# Patient Record
Sex: Male | Born: 1945 | Race: Black or African American | Hispanic: No | Marital: Single | Smoking: Former smoker
Health system: Southern US, Community
[De-identification: ages and names within clinical notes are randomized; demographics above are authoritative.]

## PROBLEM LIST (undated history)

## (undated) DIAGNOSIS — C7931 Secondary malignant neoplasm of brain: Secondary | ICD-10-CM

## (undated) DIAGNOSIS — C3490 Malignant neoplasm of unspecified part of unspecified bronchus or lung: Secondary | ICD-10-CM

---

## 2017-02-03 ENCOUNTER — Encounter (HOSPITAL_COMMUNITY): Payer: Self-pay

## 2017-02-03 ENCOUNTER — Emergency Department (HOSPITAL_COMMUNITY): Payer: Medicare Other

## 2017-02-03 ENCOUNTER — Inpatient Hospital Stay (HOSPITAL_COMMUNITY)
Admission: EM | Admit: 2017-02-03 | Discharge: 2017-03-08 | DRG: 175 | Disposition: E | Payer: Medicare Other | Attending: Internal Medicine | Admitting: Internal Medicine

## 2017-02-03 DIAGNOSIS — Z85118 Personal history of other malignant neoplasm of bronchus and lung: Secondary | ICD-10-CM

## 2017-02-03 DIAGNOSIS — R000 Tachycardia, unspecified: Secondary | ICD-10-CM | POA: Diagnosis present

## 2017-02-03 DIAGNOSIS — R001 Bradycardia, unspecified: Secondary | ICD-10-CM | POA: Diagnosis present

## 2017-02-03 DIAGNOSIS — I452 Bifascicular block: Secondary | ICD-10-CM | POA: Diagnosis present

## 2017-02-03 DIAGNOSIS — D696 Thrombocytopenia, unspecified: Secondary | ICD-10-CM | POA: Diagnosis present

## 2017-02-03 DIAGNOSIS — R590 Localized enlarged lymph nodes: Secondary | ICD-10-CM | POA: Diagnosis present

## 2017-02-03 DIAGNOSIS — C7931 Secondary malignant neoplasm of brain: Secondary | ICD-10-CM | POA: Diagnosis present

## 2017-02-03 DIAGNOSIS — I248 Other forms of acute ischemic heart disease: Secondary | ICD-10-CM | POA: Diagnosis present

## 2017-02-03 DIAGNOSIS — I959 Hypotension, unspecified: Secondary | ICD-10-CM | POA: Diagnosis present

## 2017-02-03 DIAGNOSIS — I2699 Other pulmonary embolism without acute cor pulmonale: Principal | ICD-10-CM | POA: Diagnosis present

## 2017-02-03 DIAGNOSIS — J189 Pneumonia, unspecified organism: Secondary | ICD-10-CM | POA: Diagnosis present

## 2017-02-03 DIAGNOSIS — R0602 Shortness of breath: Secondary | ICD-10-CM

## 2017-02-03 DIAGNOSIS — D649 Anemia, unspecified: Secondary | ICD-10-CM | POA: Diagnosis present

## 2017-02-03 DIAGNOSIS — R570 Cardiogenic shock: Secondary | ICD-10-CM | POA: Diagnosis present

## 2017-02-03 DIAGNOSIS — I10 Essential (primary) hypertension: Secondary | ICD-10-CM | POA: Diagnosis present

## 2017-02-03 DIAGNOSIS — I2609 Other pulmonary embolism with acute cor pulmonale: Secondary | ICD-10-CM

## 2017-02-03 DIAGNOSIS — E860 Dehydration: Secondary | ICD-10-CM | POA: Diagnosis present

## 2017-02-03 DIAGNOSIS — R6510 Systemic inflammatory response syndrome (SIRS) of non-infectious origin without acute organ dysfunction: Secondary | ICD-10-CM | POA: Diagnosis not present

## 2017-02-03 DIAGNOSIS — R071 Chest pain on breathing: Secondary | ICD-10-CM | POA: Diagnosis not present

## 2017-02-03 DIAGNOSIS — Z66 Do not resuscitate: Secondary | ICD-10-CM | POA: Diagnosis present

## 2017-02-03 DIAGNOSIS — J984 Other disorders of lung: Secondary | ICD-10-CM | POA: Diagnosis not present

## 2017-02-03 DIAGNOSIS — R079 Chest pain, unspecified: Secondary | ICD-10-CM | POA: Diagnosis present

## 2017-02-03 DIAGNOSIS — C3490 Malignant neoplasm of unspecified part of unspecified bronchus or lung: Secondary | ICD-10-CM | POA: Diagnosis present

## 2017-02-03 DIAGNOSIS — I2602 Saddle embolus of pulmonary artery with acute cor pulmonale: Secondary | ICD-10-CM | POA: Diagnosis not present

## 2017-02-03 DIAGNOSIS — Z9221 Personal history of antineoplastic chemotherapy: Secondary | ICD-10-CM | POA: Diagnosis not present

## 2017-02-03 DIAGNOSIS — J181 Lobar pneumonia, unspecified organism: Secondary | ICD-10-CM | POA: Diagnosis not present

## 2017-02-03 HISTORY — DX: Secondary malignant neoplasm of brain: C79.31

## 2017-02-03 HISTORY — DX: Malignant neoplasm of unspecified part of unspecified bronchus or lung: C34.90

## 2017-02-03 LAB — I-STAT CHEM 8, ED
BUN: 31 mg/dL — ABNORMAL HIGH (ref 6–20)
CREATININE: 0.7 mg/dL (ref 0.61–1.24)
Calcium, Ion: 1.05 mmol/L — ABNORMAL LOW (ref 1.15–1.40)
Chloride: 102 mmol/L (ref 101–111)
Glucose, Bld: 106 mg/dL — ABNORMAL HIGH (ref 65–99)
HEMATOCRIT: 37 % — AB (ref 39.0–52.0)
HEMOGLOBIN: 12.6 g/dL — AB (ref 13.0–17.0)
POTASSIUM: 4.1 mmol/L (ref 3.5–5.1)
Sodium: 138 mmol/L (ref 135–145)
TCO2: 32 mmol/L (ref 0–100)

## 2017-02-03 LAB — CBC
HEMATOCRIT: 38 % — AB (ref 39.0–52.0)
Hemoglobin: 12.5 g/dL — ABNORMAL LOW (ref 13.0–17.0)
MCH: 29.6 pg (ref 26.0–34.0)
MCHC: 32.9 g/dL (ref 30.0–36.0)
MCV: 90 fL (ref 78.0–100.0)
Platelets: 70 10*3/uL — ABNORMAL LOW (ref 150–400)
RBC: 4.22 MIL/uL (ref 4.22–5.81)
RDW: 15.9 % — ABNORMAL HIGH (ref 11.5–15.5)
WBC: 11 10*3/uL — AB (ref 4.0–10.5)

## 2017-02-03 LAB — I-STAT TROPONIN, ED: Troponin i, poc: 0.14 ng/mL (ref 0.00–0.08)

## 2017-02-03 LAB — D-DIMER, QUANTITATIVE (NOT AT ARMC): D DIMER QUANT: 17.08 ug{FEU}/mL — AB (ref 0.00–0.50)

## 2017-02-03 MED ORDER — ACETAMINOPHEN 650 MG RE SUPP: 650.0000 mg | Freq: Four times a day (QID) | RECTAL | Status: DC | PRN

## 2017-02-03 MED ORDER — ONDANSETRON HCL 4 MG/2ML IJ SOLN: 4.0000 mg | Freq: Four times a day (QID) | INTRAMUSCULAR | Status: DC | PRN

## 2017-02-03 MED ORDER — HEPARIN (PORCINE) IN NACL 100-0.45 UNIT/ML-% IJ SOLN: 1000.0000 [IU]/h | INTRAMUSCULAR | Status: DC

## 2017-02-03 MED ORDER — HEPARIN BOLUS VIA INFUSION
4000.0000 [IU] | Freq: Once | INTRAVENOUS | Status: AC
  Administered 2017-02-03: 4000 [IU] via INTRAVENOUS
  Filled 2017-02-03: qty 4000

## 2017-02-03 MED ORDER — IPRATROPIUM-ALBUTEROL 0.5-2.5 (3) MG/3ML IN SOLN: 3.0000 mL | RESPIRATORY_TRACT | Status: DC | PRN

## 2017-02-03 MED ORDER — SODIUM CHLORIDE 0.9 % IV SOLN
INTRAVENOUS | Status: DC
  Administered 2017-02-04: via INTRAVENOUS

## 2017-02-03 MED ORDER — VANCOMYCIN HCL IN DEXTROSE 1-5 GM/200ML-% IV SOLN
1000.0000 mg | Freq: Two times a day (BID) | INTRAVENOUS | Status: DC
  Administered 2017-02-04: 1000 mg via INTRAVENOUS
  Filled 2017-02-03 (×2): qty 200

## 2017-02-03 MED ORDER — IOPAMIDOL (ISOVUE-370) INJECTION 76%
INTRAVENOUS | Status: AC
  Administered 2017-02-03: 100 mL
  Filled 2017-02-03: qty 100

## 2017-02-03 MED ORDER — BISACODYL 5 MG PO TBEC
5.0000 mg | DELAYED_RELEASE_TABLET | Freq: Every day | ORAL | Status: DC | PRN
  Filled 2017-02-03: qty 1

## 2017-02-03 MED ORDER — MORPHINE SULFATE (PF) 4 MG/ML IV SOLN: 1.0000 mg | INTRAVENOUS | Status: DC | PRN

## 2017-02-03 MED ORDER — POLYETHYLENE GLYCOL 3350 17 G PO PACK: 17.0000 g | PACK | Freq: Every day | ORAL | Status: DC | PRN

## 2017-02-03 MED ORDER — ONDANSETRON HCL 4 MG PO TABS: 4.0000 mg | ORAL_TABLET | Freq: Four times a day (QID) | ORAL | Status: DC | PRN

## 2017-02-03 MED ORDER — OXYCODONE HCL 5 MG PO TABS: 5.0000 mg | ORAL_TABLET | ORAL | Status: DC | PRN

## 2017-02-03 MED ORDER — HEPARIN (PORCINE) IN NACL 100-0.45 UNIT/ML-% IJ SOLN
1200.0000 [IU]/h | INTRAMUSCULAR | Status: DC
  Administered 2017-02-03: 1200 [IU]/h via INTRAVENOUS
  Filled 2017-02-03: qty 250

## 2017-02-03 MED ORDER — SODIUM CHLORIDE 0.9% FLUSH
3.0000 mL | Freq: Two times a day (BID) | INTRAVENOUS | Status: DC
  Administered 2017-02-04 – 2017-02-05 (×5): 3 mL via INTRAVENOUS

## 2017-02-03 MED ORDER — ASPIRIN 81 MG PO CHEW: 324.0000 mg | CHEWABLE_TABLET | Freq: Once | ORAL | Status: DC

## 2017-02-03 MED ORDER — ACETAMINOPHEN 325 MG PO TABS
650.0000 mg | ORAL_TABLET | Freq: Four times a day (QID) | ORAL | Status: DC | PRN
  Administered 2017-02-05: 650 mg via ORAL
  Filled 2017-02-03: qty 2

## 2017-02-03 MED ORDER — PANTOPRAZOLE SODIUM 40 MG IV SOLR
40.0000 mg | INTRAVENOUS | Status: DC
  Administered 2017-02-04: 40 mg via INTRAVENOUS
  Filled 2017-02-03: qty 40

## 2017-02-03 MED ORDER — DEXTROSE 5 % IV SOLN
1.0000 g | Freq: Three times a day (TID) | INTRAVENOUS | Status: DC
  Administered 2017-02-04 – 2017-02-05 (×7): 1 g via INTRAVENOUS
  Filled 2017-02-03 (×9): qty 1

## 2017-02-03 NOTE — ED Notes (Signed)
Patient transported to CT 

## 2017-02-03 NOTE — Progress Notes (Signed)
eLink Physician-Brief Progress Note Patient Name: Ido Wollman DOB: 05/22/1946 MRN: [id number]   Date of Service  02/02/2017  HPI/Events of Note  Called for code PE H/O Lung cancer with mets to brain. B/L PE. RV/LV ratio 1.1 Hemodynamically stable, on 2 lt O2  eICU Interventions  Start heparin anticoagulation. No role for lytics due to brain mets        Gwenn Teodoro 01/09/2017, 10:56 PM

## 2017-02-03 NOTE — ED Notes (Signed)
Patient transported to X-ray 

## 2017-02-03 NOTE — H&P (Signed)
History and Physical    Julian Bryant GEX:[id number] DOB: [birth year] DOA: 01/26/2017  PCP: No PCP Per Patient   Patient coming from: Home  Chief Complaint: Chest pain, productive cough   HPI: Julian Bryant is a 71 y.o. male with medical history significant for lung cancer with brain metastasis receiving treatment in [place], [place] and now presenting to the emergency department with several days of cough productive of thick brown sputum, and acute onset of chest pain radiating through to the back. Patient is unable to contribute very much to the history due to confusion which has reportedly been stable since December and attributed to a brain metastasis. History is therefore obtained from the patient's daughter with whom he has been living. Patient had reportedly undergone his second gamma knife treatment on 01/20/2017 and had been complaining of headaches, improved with steroids, but otherwise seemed to be in his usual state until the development of a productive cough several days ago. Patient has been coughing with thick brown sputum and had an antibiotic called in by his oncologist today but had not yet started it. This afternoon, his daughter reports that he began to complain of chest pain radiating through to his back. This was a new complaint for the patient and prompted his daughter to bring him in to the ED for evaluation. His daughter also notes that he has been constipated, straining to move his bowels, and she has noted scant amounts of blood mixed in with his stool. This was discussed with and oncology navigator who advised a stool softener that he has been using. Daughter does not know of any other bleeding events. She reports that the patient had told her and the other family members that he would not want to be kept alive on a machine or be recess attended in the case of cardiac arrest, and his wife has reportedly been very supportive of this decision.   ED Course: Upon arrival to the ED,  patient is found to be afebrile, saturating adequately on room air, and with vital signs stable. EKG featured a sinus rhythm with right bundle-branch block, left anterior fascicular block, LVH, and anterior lateral T-wave abnormalities with no prior available. She chemistry panel is notable for a market elevation and BUN to creatinine ratio and CBC is notable for leukocytosis to 11,000, normocytic anemia with hemoglobin 12.5, and thrombocytopenia with platelets 70,000. Troponin is elevated to 0.14. D-dimer was elevated to 17.08. CTAP study reveals PE within the pulmonary arteries to right upper lobe, right lower lobe, and left lower lobe with CT evidence for right heart strain. Also noted on CTA has a 2.6 cm cavitary lesion with air-fluid level in the left base, likely an atypical infection, but given the history of malignancy is not excluded. Enlarged mediastinal nodes are noted on the CTA. Code PE was activated by the ED provider and the patient has been started on heparin infusion. PCCM evaluated the case and given his brain metastases, there is no role for lytics. Patient remained hemodynamically stable in the ED, he is saturating well on 2 L/m supplemental oxygen, and with mild increased work of breathing. He will be admitted to the stepdown unit for ongoing evaluation and management of chest pain with mild respiratory distress secondary to acute PE with heart strain.  Review of Systems:  Unable to obtain complete ROS secondary to patient's clinical condition with significant cognitive impairment.  Past Medical History:  Diagnosis Date  . Lung cancer ([place])   . Metastatic cancer to  brain [facility])     History reviewed. No pertinent surgical history.   reports that he has quit smoking. He has never used smokeless tobacco. He reports that he does not drink alcohol or use drugs.  No Known Allergies  History reviewed. No pertinent family history.   Prior to Admission medications   Not on File     Physical Exam: Vitals:   02/04/2017 2145 01/25/2017 2200 01/27/2017 2215 01/11/2017 2245  BP: 123/71 109/66 108/66 135/73  Pulse: 66 69 71 76  Resp:  (!) 9 (!) 8 24  Temp:      TempSrc:      SpO2: 99% 99% 98% 99%  Weight:   77.1 kg (170 lb)       Constitutional: NAD, calm, comfortable. Increased WOB.  Eyes: PERTLA, lids and conjunctivae normal ENMT: Mucous membranes are moist. Posterior pharynx clear of any exudate or lesions.   Neck: normal, supple, no masses, no thyromegaly Respiratory: Coarse rhonchi at left mid-lung. Accessory muscle recruitment. No pallor or cyanosis.  Cardiovascular: S1 & S2 heard, regular rate and rhythm. No extremity edema. No significant JVD. Abdomen: No distension, no tenderness, no masses palpated. Bowel sounds normal.  Musculoskeletal: no clubbing / cyanosis. No joint deformity upper and lower extremities. Normal muscle tone.  Skin: no significant rashes, lesions, ulcers. Warm, dry, well-perfused. Poor turgor.  Neurologic: No gross facial asymmetry. Moving all extremities spontaneously. Grip strength 4-5/5 b/l.  Psychiatric: Calm and cooperative. Somnolent, easily roused, oriented to person only.     Labs on Admission: I have personally reviewed following labs and imaging studies  CBC:  Recent Labs Lab 01/11/2017 2007 01/24/2017 2008  WBC 11.0*  --   HGB 12.5* 12.6*  HCT 38.0* 37.0*  MCV 90.0  --   PLT 70*  --    Basic Metabolic Panel:  Recent Labs Lab 02/02/2017 2008  NA 138  K 4.1  CL 102  GLUCOSE 106*  BUN 31*  CREATININE 0.70   GFR: CrCl cannot be calculated (Unknown ideal weight.). Liver Function Tests: No results for input(s): AST, ALT, ALKPHOS, BILITOT, PROT, ALBUMIN in the last 168 hours. No results for input(s): LIPASE, AMYLASE in the last 168 hours. No results for input(s): AMMONIA in the last 168 hours. Coagulation Profile: No results for input(s): INR, PROTIME in the last 168 hours. Cardiac Enzymes: No results for  input(s): CKTOTAL, CKMB, CKMBINDEX, TROPONINI in the last 168 hours. BNP (last 3 results) No results for input(s): PROBNP in the last 8760 hours. HbA1C: No results for input(s): HGBA1C in the last 72 hours. CBG: No results for input(s): GLUCAP in the last 168 hours. Lipid Profile: No results for input(s): CHOL, HDL, LDLCALC, TRIG, CHOLHDL, LDLDIRECT in the last 72 hours. Thyroid Function Tests: No results for input(s): TSH, T4TOTAL, FREET4, T3FREE, THYROIDAB in the last 72 hours. Anemia Panel: No results for input(s): VITAMINB12, FOLATE, FERRITIN, TIBC, IRON, RETICCTPCT in the last 72 hours. Urine analysis: No results found for: COLORURINE, APPEARANCEUR, LABSPEC, PHURINE, GLUCOSEU, HGBUR, BILIRUBINUR, KETONESUR, PROTEINUR, UROBILINOGEN, NITRITE, LEUKOCYTESUR Sepsis Labs: '@LABRCNTIP'$ (procalcitonin:4,lacticidven:4) )No results found for this or any previous visit (from the past 240 hour(s)).   Radiological Exams on Admission: Dg Chest 2 View  Result Date: 01/30/2017 CLINICAL DATA:  Left-sided chest pain. Patient reports lung cancer with radiation last Monday. EXAM: CHEST  2 VIEW COMPARISON:  None. FINDINGS: The lungs are hyperinflated. Heart is at the upper limits of normal in size. Mild aortic tortuosity and atherosclerosis. Equivocal fullness in the left hilum.  Patient's known lung cancer is not well seen radiographically. Streaky left basilar atelectasis. Presumed nipple shadows project over the lung bases. No dominant pulmonary mass or radiographic evident nodule. No pleural fluid or pneumothorax. No acute osseous abnormalities are seen. IMPRESSION: Hyperinflation with streaky left basilar atelectasis. Patient's known lung cancer is not well seen. Equivocal fullness in the left hilum. No prior exams available for comparison. Electronically Signed   By: Jeb Levering M.D.   On: 01/14/2017 19:50   Ct Angio Chest Pe W And/or Wo Contrast  Result Date: 01/12/2017 CLINICAL DATA:  Acute onset  of sharp left-sided chest pain. Confusion. Known brain metastases. Initial encounter. EXAM: CT ANGIOGRAPHY CHEST WITH CONTRAST TECHNIQUE: Multidetector CT imaging of the chest was performed using the standard protocol during bolus administration of intravenous contrast. Multiplanar CT image reconstructions and MIPs were obtained to evaluate the vascular anatomy. CONTRAST:  80 mL of Isovue 370 IV contrast COMPARISON:  Chest radiograph performed earlier today at 7:25 p.m. FINDINGS: Cardiovascular: Pulmonary embolus is noted within the pulmonary arteries to the right upper lobe, right lower lobe and left lower lobe, with an RV/LV ratio of 1.1, corresponding to right heart strain and at least submassive pulmonary embolus. The heart is normal in size. Scattered calcification is noted along the thoracic aorta. The great vessels are grossly unremarkable in appearance. Mediastinum/Nodes: A large 1.8 cm periaortic node is seen, and additional smaller mediastinal nodes are noted, including a 1.3 cm aortopulmonary window node, likely reflecting metastatic disease. No pericardial effusion is identified. The thyroid gland is unremarkable in appearance. No axillary lymphadenopathy is appreciated. Lungs/Pleura: Note is made of a focal 2.6 cm cavitary lesion with an air-fluid level at the left lung base. The appearance is more suspicious for atypical infection rather than malignancy, though given the patient's history of lung cancer, malignancy cannot be excluded. Would correlate with the patient's clinical findings. Mild bibasilar atelectasis is noted. No pleural effusion or pneumothorax is seen. Upper Abdomen: The visualized portions of the liver and spleen are grossly unremarkable. The visualized portions of the adrenal glands are grossly unremarkable. A left renal cyst is seen. Musculoskeletal: No acute osseous abnormalities are identified. The visualized musculature is unremarkable in appearance. Mild bilateral gynecomastia is  noted. Review of the MIP images confirms the above findings. IMPRESSION: 1. Pulmonary embolus within the pulmonary arteries to the right upper lobe, right lower lobe and left lower lobe. CT evidence of right heart strain (RV/LV Ratio = 1.1) consistent with at least submassive (intermediate risk) PE. The presence of right heart strain has been associated with an increased risk of morbidity and mortality. Please activate Code PE by paging [phone]. 2. 2.6 cm cavitary lesion with an air-fluid level of the left lung base. The appearance is more suspicious for atypical infection rather than malignancy, though given the patient's history of lung cancer, malignancy cannot be excluded. Would correlate with the patient's clinical findings. 3. Enlarged mediastinal nodes measure up to 1.8 cm, likely reflecting metastatic disease. 4. Mild bibasilar atelectasis noted. 5. Left renal cyst seen. 6. Mild bilateral gynecomastia noted. Critical Value/emergent results were called by telephone at the time of interpretation on 01/22/2017 at 9:54 pm to [facility] NP, who verbally acknowledged these results. Electronically Signed   By: Garald Balding M.D.   On: 01/30/2017 21:58    EKG: Independently reviewed. Sinus rhythm, RBBB, LaFB, LVH by voltage criteria, and anterolateral T-wave abnormality; no priors available.   Assessment/Plan  1. Acute PE with heart-strain  -  Pt presents with chest pain, noted to have increased WOB, mildly elevated troponin, and non-specific EKG changes  - D-dimer is markedly elevated and CTA chest positive for PE in pulm art to RUL, RLL, and LLL with CT-evidence for heart-strain, RV/LV ratio 1.1  - Code PE activated; PCCM input appreciated, no role for lytics d/t brain mets; recent gamma-knife surgery not a contraindication for anticoagulation  - IV heparin infusion started; discussed risks with patient's daughter who agrees to proceed  - Check TTE, trend troponin, avoid negative inotropes  2.  Lung cancer with brain metastasis  - Pt has been receiving care in [place], [place] at [facility] with oncologist, Dr. Ludger Nutting; records requested  - Daughter reports his second gamma-knife procedure on 01/20/17, followed by HA's that improved with steroid    3. Pneumonia  - CTA chest features a cavitary lesion in LLL with air-fluid level, felt to represent infection over malignancy  - There is increased WOB and mild leukocytosis, no hypoxia, no fever - Pt daughter reports recent cough productive of thick brown sputum  - Blood cultures obtained, sputum culture if patient can produce - Continue cefepime and vancomycin empirically, check MRSA screen, follow cultures and clinical response   4. Normocytic anemia, thrombocytopenia  - Mild normocytic anemia to 12.5 with no apparent blood-loss and no prior CBC available   - Platelets 70,000 on admission without apparent bleeding or rash; possibly secondary to infection vs chemo  - Follow closely on anticoagulation    DVT prophylaxis: IV heparin per VTE treatment dosing Code Status: DNR Family Communication: Daughter updated by phone Disposition Plan: Admit to SDU Consults called: PCCM Admission status: Inpatient    Vianne Bulls, MD [facility] Pager [phone]  If 7PM-7AM, please contact night-coverage [email or web address] Password [email or web address]  01/15/2017, 11:01 PM

## 2017-02-03 NOTE — ED Provider Notes (Signed)
[facility] DEPT Provider Note   CSN: [id number] Arrival date & time: 01/12/2017  1826     History   Chief Complaint Chief Complaint  Patient presents with  . Chest Pain    HPI Julian Bryant is a 71 y.o. male with a history of HTN, borderline DM, thyroid disease, schizophrenia, currently with Stage IV lung ca with metastatic lesions to his brain presenting with intermittent left sided chest pain radiating into his mid back since yesterday.  He denies shortness of breath.  He is a resident of [place], [place] where his medical care is obtained, he is here to stay with daughter while his primary caregiver is recovering from a medical condition.  Daughter states he has been afebrile, has had a nonproductive mild cough, no n/v/d.  He last had radiation treatment one week ago, not undergoing chemotherapy currently.  Pt has confusion which is new since December and is not a reliable history giver per daughter at bedside.  The history is provided by the patient and a relative. The history is limited by the condition of the patient.    Past Medical History:  Diagnosis Date  . Lung cancer ([place])   . Metastatic cancer to brain [facility])     There are no active problems to display for this patient.   History reviewed. No pertinent surgical history.     Home Medications    Prior to Admission medications   Not on File    Family History History reviewed. No pertinent family history.  Social History Social History  Substance Use Topics  . Smoking status: Former Research scientist (life sciences)  . Smokeless tobacco: Never Used  . Alcohol use No     Allergies   Patient has no known allergies.   Review of Systems Review of Systems  Constitutional: Negative for fever.  Eyes: Negative.   Respiratory: Positive for cough. Negative for shortness of breath.   Cardiovascular: Positive for chest pain.  Gastrointestinal: Negative for abdominal pain.  Genitourinary: Negative.   Musculoskeletal: Negative.     Psychiatric/Behavioral: Negative.      Physical Exam Updated Vital Signs BP 133/77   Pulse 77   Temp 97.8 F (36.6 C) (Oral)   Resp 20   SpO2 100%   Physical Exam  Constitutional: He appears well-developed and well-nourished.  HENT:  Head: Normocephalic and atraumatic.  Eyes: Conjunctivae are normal.  Neck: Normal range of motion.  Cardiovascular: Normal rate, regular rhythm, normal heart sounds and intact distal pulses.   Pulmonary/Chest: Effort normal. No respiratory distress. He has no wheezes. He has rhonchi in the left lower field. He exhibits no tenderness.  Wet sounding cough,  Trace rhonchi left base.  Abdominal: Soft. Bowel sounds are normal. There is no tenderness.  Musculoskeletal: Normal range of motion. He exhibits no edema or tenderness.  No ankle edema, calves soft and nontender.  Neurological: He is alert.  Skin: Skin is warm and dry.  Psychiatric: He has a normal mood and affect.  Nursing note and vitals reviewed.    ED Treatments / Results  Labs (all labs ordered are listed, but only abnormal results are displayed) Labs Reviewed - No data to display  EKG  EKG Interpretation  Date/Time:  Tuesday February 03 2017 18:30:25 EST Ventricular Rate:  78 PR Interval:    QRS Duration: 150 QT Interval:  425 QTC Calculation: 485 R Axis:   -73 Text Interpretation:  Sinus rhythm RBBB and LAFB Left ventricular hypertrophy Probable lateral infarct, age indeterminate No previous tracing  Confirmed by Maryan Rued  MD, Loree Fee ([id number]) on 02/02/2017 6:39:51 PM       Radiology Dg Chest 2 View  Result Date: 01/23/2017 CLINICAL DATA:  Left-sided chest pain. Patient reports lung cancer with radiation last Monday. EXAM: CHEST  2 VIEW COMPARISON:  None. FINDINGS: The lungs are hyperinflated. Heart is at the upper limits of normal in size. Mild aortic tortuosity and atherosclerosis. Equivocal fullness in the left hilum. Patient's known lung cancer is not well seen  radiographically. Streaky left basilar atelectasis. Presumed nipple shadows project over the lung bases. No dominant pulmonary mass or radiographic evident nodule. No pleural fluid or pneumothorax. No acute osseous abnormalities are seen. IMPRESSION: Hyperinflation with streaky left basilar atelectasis. Patient's known lung cancer is not well seen. Equivocal fullness in the left hilum. No prior exams available for comparison. Electronically Signed   By: Jeb Levering M.D.   On: 01/13/2017 19:50    Procedures Procedures (including critical care time)  Medications Ordered in ED Medications - No data to display   Initial Impression / Assessment and Plan / ED Course  I have reviewed the triage vital signs and the nursing notes.  Pertinent labs & imaging results that were available during my care of the patient were reviewed by me and considered in my medical decision making (see chart for details).     Pt pending labs, CT angio to assess for PE.  Discussed with Florene Glen NP who will follow pt.  Final Clinical Impressions(s) / ED Diagnoses   Final diagnoses:  None    New Prescriptions New Prescriptions   No medications on file     Landis Martins 01/20/2017 2008    Blanchie Dessert, MD 03/04/17 [id number]    Blanchie Dessert, MD 2017-03-04 0151

## 2017-02-03 NOTE — ED Triage Notes (Signed)
Pt arrived via [facility] from daughters home c/o sharp left sided chest pain that radiates to his back 3/10.  Pt had lung cancer radiation treatment last Monday at [facility], pt is confused at baseline and has mets to brain.  EMS gave 324 ASA.

## 2017-02-03 NOTE — ED Notes (Signed)
[name] [phone]

## 2017-02-03 NOTE — Progress Notes (Signed)
Pharmacy Antibiotic Note  Julian Bryant is a 71 y.o. male admitted on 02/02/2017 with pneumonia.  Pharmacy has been consulted for Vancomycin dosing. In addition to PE found on CT Angio, pt also found to have cavitary lesion. WBC 11. Renal function ok. Pt with metastatic lung CA.   Plan: -Vancomycin 1000 mg IV q12h -Cefepime per MD -Trend WBC, temp, renal function  -Drug levels as indicated   Weight: 170 lb (77.1 kg)  Temp (24hrs), Avg:97.8 F (36.6 C), Min:97.8 F (36.6 C), Max:97.8 F (36.6 C)   Recent Labs Lab 01/21/2017 2007 01/16/2017 2008  WBC 11.0*  --   CREATININE  --  0.70    CrCl cannot be calculated (Unknown ideal weight.).    No Known Allergies   Julian Bryant, Julian Bryant 01/16/2017 11:03 PM

## 2017-02-03 NOTE — Progress Notes (Signed)
ANTICOAGULATION CONSULT NOTE - Initial Consult  Pharmacy Consult for heparin Indication: pulmonary embolus  No Known Allergies  Patient Measurements:   Heparin Dosing Weight: 77 kg  Vital Signs: Temp: 97.8 F (36.6 C) (02/27 1831) Temp Source: Oral (02/27 1831) BP: 108/66 (02/27 2215) Pulse Rate: 71 (02/27 2215)  Labs:  Recent Labs  01/24/2017 2007 01/27/2017 2008  HGB 12.5* 12.6*  HCT 38.0* 37.0*  PLT 70*  --   CREATININE  --  0.70    CrCl cannot be calculated (Unknown ideal weight.).   Medical History: Past Medical History:  Diagnosis Date  . Lung cancer ([place])   . Metastatic cancer to brain [facility])     Assessment: 61 yoM presents w/ CP and found to have acute PE on CT w/ R heart strain to begin on heparin per pharmacy. Hgb normal, platelets low at 70,  pt reports not being on [place] PTA.   Goal of Therapy:  Heparin level 0.3-0.7 units/ml Monitor platelets by anticoagulation protocol: Yes   Plan:  -Heparin 4000 units x1 -Heparin 1200 units/hr -Check 8-hr heparin level -Monitor heparin level, CBC, S/Sx bleeding daily  Arrie Senate, PharmD PGY-1 Pharmacy Resident Pager: [phone] 01/09/2017

## 2017-02-03 NOTE — ED Notes (Signed)
Unsuccessful attempt to draw blood for cultures. IV team consult placed by Lovena Le, RN

## 2017-02-03 NOTE — ED Notes (Signed)
Daughter, Kein Carlberg, [phone]

## 2017-02-03 NOTE — ED Provider Notes (Signed)
Patient has bilateral pulmonary emboli as well as a cavitated lesion concerning for infection.  He will be admitted to the hospitalist service, started on heparin. Code PE was called.  His chart was reviewed.  He does not meet criteria for thrombolytics.  He'll be treated with heparin only   Junius Creamer, NP 01/28/2017 2252    Blanchie Dessert, MD 2017/02/18 4034

## 2017-02-04 ENCOUNTER — Inpatient Hospital Stay (HOSPITAL_COMMUNITY): Payer: Medicare Other

## 2017-02-04 DIAGNOSIS — C7931 Secondary malignant neoplasm of brain: Secondary | ICD-10-CM

## 2017-02-04 DIAGNOSIS — R071 Chest pain on breathing: Secondary | ICD-10-CM

## 2017-02-04 DIAGNOSIS — C3490 Malignant neoplasm of unspecified part of unspecified bronchus or lung: Secondary | ICD-10-CM

## 2017-02-04 DIAGNOSIS — I2699 Other pulmonary embolism without acute cor pulmonale: Secondary | ICD-10-CM

## 2017-02-04 DIAGNOSIS — J984 Other disorders of lung: Secondary | ICD-10-CM

## 2017-02-04 LAB — GLUCOSE, CAPILLARY
GLUCOSE-CAPILLARY: 96 mg/dL (ref 65–99)
GLUCOSE-CAPILLARY: 199 mg/dL — AB (ref 65–99)
Glucose-Capillary: 142 mg/dL — ABNORMAL HIGH (ref 65–99)

## 2017-02-04 LAB — BASIC METABOLIC PANEL
Anion gap: 11 (ref 5–15)
BUN: 18 mg/dL (ref 6–20)
CALCIUM: 8.5 mg/dL — AB (ref 8.9–10.3)
CO2: 28 mmol/L (ref 22–32)
Chloride: 103 mmol/L (ref 101–111)
Creatinine, Ser: 0.76 mg/dL (ref 0.61–1.24)
GFR calc Af Amer: >60 mL/min (ref >60)
GFR calc non Af Amer: >60 mL/min (ref >60)
GLUCOSE: 77 mg/dL (ref 65–99)
Potassium: 3.9 mmol/L (ref 3.5–5.1)
Sodium: 142 mmol/L (ref 135–145)

## 2017-02-04 LAB — COMPREHENSIVE METABOLIC PANEL
ALT: 54 U/L (ref 17–63)
AST: 53 U/L — AB (ref 15–41)
Albumin: 2.6 g/dL — ABNORMAL LOW (ref 3.5–5.0)
Alkaline Phosphatase: 58 U/L (ref 38–126)
Anion gap: 11 (ref 5–15)
BUN: 22 mg/dL — AB (ref 6–20)
CHLORIDE: 103 mmol/L (ref 101–111)
CO2: 25 mmol/L (ref 22–32)
CREATININE: 0.74 mg/dL (ref 0.61–1.24)
Calcium: 8.5 mg/dL — ABNORMAL LOW (ref 8.9–10.3)
GFR calc Af Amer: >60 mL/min (ref >60)
GFR calc non Af Amer: >60 mL/min (ref >60)
GLUCOSE: 92 mg/dL (ref 65–99)
POTASSIUM: 5 mmol/L (ref 3.5–5.1)
SODIUM: 139 mmol/L (ref 135–145)
Total Bilirubin: 1.4 mg/dL — ABNORMAL HIGH (ref 0.3–1.2)
Total Protein: 5.4 g/dL — ABNORMAL LOW (ref 6.5–8.1)

## 2017-02-04 LAB — CBC
HEMATOCRIT: 36.8 % — AB (ref 39.0–52.0)
Hemoglobin: 11.8 g/dL — ABNORMAL LOW (ref 13.0–17.0)
MCH: 28.9 pg (ref 26.0–34.0)
MCHC: 32.1 g/dL (ref 30.0–36.0)
MCV: 90.2 fL (ref 78.0–100.0)
Platelets: 72 10*3/uL — ABNORMAL LOW (ref 150–400)
RBC: 4.08 MIL/uL — ABNORMAL LOW (ref 4.22–5.81)
RDW: 16 % — AB (ref 11.5–15.5)
WBC: 9.6 10*3/uL (ref 4.0–10.5)

## 2017-02-04 LAB — POCT I-STAT 3, ART BLOOD GAS (G3+)
ACID-BASE EXCESS: 2 mmol/L (ref 0.0–2.0)
Bicarbonate: 25.4 mmol/L (ref 20.0–28.0)
O2 Saturation: 98 %
PH ART: 7.482 — AB (ref 7.350–7.450)
TCO2: 26 mmol/L (ref 0–100)
pCO2 arterial: 34 mmHg (ref 32.0–48.0)
pO2, Arterial: 95 mmHg (ref 83.0–108.0)

## 2017-02-04 LAB — TROPONIN I
TROPONIN I: 0.17 ng/mL — AB (ref <0.03)
Troponin I: 0.15 ng/mL (ref <0.03)
Troponin I: 0.14 ng/mL (ref <0.03)

## 2017-02-04 LAB — HEPARIN LEVEL (UNFRACTIONATED): Heparin Unfractionated: 1.26 IU/mL — ABNORMAL HIGH (ref 0.30–0.70)

## 2017-02-04 LAB — ECHOCARDIOGRAM COMPLETE
Height: 66 in
Weight: 2680.79 oz

## 2017-02-04 LAB — LACTIC ACID, PLASMA: Lactic Acid, Venous: 1.6 mmol/L (ref 0.5–1.9)

## 2017-02-04 LAB — MRSA PCR SCREENING: MRSA BY PCR: NEGATIVE

## 2017-02-04 LAB — HIV ANTIBODY (ROUTINE TESTING W REFLEX): HIV Screen 4th Generation wRfx: NONREACTIVE

## 2017-02-04 MED ORDER — SERTRALINE HCL 50 MG PO TABS
50.0000 mg | ORAL_TABLET | Freq: Every day | ORAL | Status: DC
  Administered 2017-02-05: 50 mg via ORAL
  Filled 2017-02-04 (×2): qty 1

## 2017-02-04 MED ORDER — TAMSULOSIN HCL 0.4 MG PO CAPS
0.4000 mg | ORAL_CAPSULE | Freq: Every day | ORAL | Status: DC
  Administered 2017-02-04 – 2017-02-05 (×2): 0.4 mg via ORAL
  Filled 2017-02-04 (×2): qty 1

## 2017-02-04 MED ORDER — PANTOPRAZOLE SODIUM 40 MG PO TBEC
40.0000 mg | DELAYED_RELEASE_TABLET | Freq: Every day | ORAL | Status: DC
  Administered 2017-02-04 – 2017-02-05 (×2): 40 mg via ORAL
  Filled 2017-02-04 (×2): qty 1

## 2017-02-04 MED ORDER — METOPROLOL TARTRATE 12.5 MG HALF TABLET
12.5000 mg | ORAL_TABLET | Freq: Two times a day (BID) | ORAL | Status: DC
  Administered 2017-02-04 – 2017-02-05 (×2): 12.5 mg via ORAL
  Filled 2017-02-04 (×2): qty 1

## 2017-02-04 MED ORDER — OXYCODONE HCL 5 MG PO TABS: 5.0000 mg | ORAL_TABLET | ORAL | Status: DC | PRN

## 2017-02-04 MED ORDER — SODIUM CHLORIDE 0.9 % IV SOLN: INTRAVENOUS | Status: DC

## 2017-02-04 MED ORDER — MORPHINE SULFATE (PF) 4 MG/ML IV SOLN
1.0000 mg | INTRAVENOUS | Status: DC | PRN
  Administered 2017-02-04 – 2017-02-05 (×3): 2 mg via INTRAVENOUS
  Filled 2017-02-04 (×3): qty 1

## 2017-02-04 MED ORDER — LEVETIRACETAM 500 MG PO TABS
500.0000 mg | ORAL_TABLET | Freq: Two times a day (BID) | ORAL | Status: DC
  Administered 2017-02-04 – 2017-02-05 (×2): 500 mg via ORAL
  Filled 2017-02-04 (×2): qty 1

## 2017-02-04 MED ORDER — BENZTROPINE MESYLATE 0.5 MG PO TABS
1.0000 mg | ORAL_TABLET | Freq: Two times a day (BID) | ORAL | Status: DC
  Administered 2017-02-04 – 2017-02-05 (×2): 1 mg via ORAL
  Filled 2017-02-04 (×2): qty 2

## 2017-02-04 MED ORDER — RISPERIDONE 2 MG PO TABS
4.0000 mg | ORAL_TABLET | Freq: Two times a day (BID) | ORAL | Status: DC
  Administered 2017-02-04 – 2017-02-05 (×2): 4 mg via ORAL
  Filled 2017-02-04 (×4): qty 2

## 2017-02-04 MED ORDER — SODIUM CHLORIDE 0.9 % IV BOLUS (SEPSIS)
500.0000 mL | Freq: Once | INTRAVENOUS | Status: AC
  Administered 2017-02-04: 500 mL via INTRAVENOUS

## 2017-02-04 MED ORDER — METOPROLOL TARTRATE 5 MG/5ML IV SOLN
INTRAVENOUS | Status: AC
  Administered 2017-02-04: 2.5 mg via INTRAVENOUS
  Filled 2017-02-04: qty 5

## 2017-02-04 MED ORDER — METOPROLOL TARTRATE 5 MG/5ML IV SOLN
2.5000 mg | Freq: Once | INTRAVENOUS | Status: AC
Start: 2017-02-04 — End: 2017-02-04
  Administered 2017-02-04: 2.5 mg via INTRAVENOUS

## 2017-02-04 MED ORDER — ENOXAPARIN SODIUM 80 MG/0.8ML ~~LOC~~ SOLN
80.0000 mg | Freq: Two times a day (BID) | SUBCUTANEOUS | Status: DC
  Administered 2017-02-04 – 2017-02-05 (×3): 80 mg via SUBCUTANEOUS
  Filled 2017-02-04 (×3): qty 0.8

## 2017-02-04 MED ORDER — DOCUSATE SODIUM 100 MG PO CAPS
100.0000 mg | ORAL_CAPSULE | Freq: Two times a day (BID) | ORAL | Status: DC
  Administered 2017-02-04 – 2017-02-05 (×2): 100 mg via ORAL
  Filled 2017-02-04 (×2): qty 1

## 2017-02-04 MED ORDER — PROPYLTHIOURACIL 50 MG PO TABS
50.0000 mg | ORAL_TABLET | Freq: Every day | ORAL | Status: DC
  Administered 2017-02-04 – 2017-02-05 (×2): 50 mg via ORAL
  Filled 2017-02-04 (×3): qty 1

## 2017-02-04 NOTE — Progress Notes (Signed)
ANTICOAGULATION CONSULT NOTE - Follow Up Consult  Pharmacy Consult for heparin > enoxaparin Indication: pulmonary embolus  No Known Allergies  Patient Measurements: Height: '5\' 6"'$  (167.6 cm) Weight: 167 lb 8.8 oz (76 kg) IBW/kg (Calculated) : 63.8 Heparin Dosing Weight: 77 kg  Vital Signs: Temp: 99.4 F (37.4 C) (02/28 0803) Temp Source: Axillary (02/28 0803) BP: 128/69 (02/28 0803) Pulse Rate: 97 (02/28 0803)  Labs:  Recent Labs  02/01/2017 2007 01/12/2017 2008 02/04/17 0001 02/04/17 0624  HGB 12.5* 12.6*  --  11.8*  HCT 38.0* 37.0*  --  36.8*  PLT 70*  --   --  72*  HEPARINUNFRC  --   --   --  1.26*  CREATININE  --  0.70 0.74 0.76  TROPONINI  --   --  0.17* 0.15*    Estimated Creatinine Clearance: 76.4 mL/min (by C-G formula based on SCr of 0.76 mg/dL).   Medical History: Past Medical History:  Diagnosis Date  . Lung cancer ([place])   . Metastatic cancer to brain [facility])     Assessment: 66 yoM with metastatic lung Ca  presents w/ CP and found to have acute PE on CT w/ R heart strain to begin on heparin per pharmacy. Hgb normal, platelets low at 70,  pt reports not being on [place] PTA.  Heparin drip 1200 uts/hr HL 1.26 drawn from opposite arm.  Plan to change to enoxaparin given VTE in setting of cancer.    Goal of Therapy: 3 HL 0.6-1.2 - 4hr after enoxaparin dose Heparin level 0.3-0.7 units/ml Monitor platelets by anticoagulation protocol: Yes   Plan:  Stop heparin  Enoxaparin '1mg'$ /kg = '80mg'$  q12hr start today  Monitor CBC, S/Sx bleeding daily  Bonnita Nasuti Pharm.D. CPP, BCPS Clinical Pharmacist [phone] 02/04/2017 11:21 AM

## 2017-02-04 NOTE — Progress Notes (Signed)
   02/04/17 1506  Vitals  Pulse Rate Source Monitor  Cardiac Rhythm NSR  Oxygen Therapy  O2 Device Room Air  Pain Assessment  Pain Assessment 0-10  Pain Score 5  Pain Location Sternum  Pain Orientation Mid  Pain Intervention(s) Medication (See eMAR);MD notified (Comment)  pt c/o CP VS listed above BP 125/61. MD [facility] notified via text, stat EKG done, Morphine IV given, Nursing will cont to monitor

## 2017-02-04 NOTE — Progress Notes (Signed)
   02/04/17 1900  Vitals  BP (!) 90/54  MAP (mmHg) (!) 61  Pulse Rate (!) 140  ECG Heart Rate (!) 141  Resp 20  Oxygen Therapy  SpO2 97 %  MD on call text paged, pt HR sus 150s and is diaphoretic, see VS above

## 2017-02-04 NOTE — Progress Notes (Signed)
  Echocardiogram 2D Echocardiogram has been performed.  Tresa Res 02/04/2017, 12:30 PM

## 2017-02-04 NOTE — Progress Notes (Signed)
[facility] TEAM 1 - Stepdown/ICU TEAM  Julian Bryant  AOZ:[id number] DOB: 1946-02-28 DOA: 01/24/2017 PCP: No PCP Per Patient    Brief Narrative:  71 y.o. male with history of lung cancer with brain metastasis receiving treatment in [place], [place] who presented to the ED with several days of cough productive of thick brown sputum, and acute onset of chest pain radiating through to the back.  Patient underwent his second gamma knife treatment on 01/20/2017.   In the ED D-dimer was elevated to 17.08. CTa revealed PE within the pulmonary arteries to right upper lobe, right lower lobe, and left lower lobe with CT evidence for right heart strain. Also noted was a 2.6 cm cavitary lesion with air-fluid level in the left base, likely an atypical infection v/s malignancy. Enlarged mediastinal nodes are noted on the CTA. Code PE was activated by the ED provider and the patient has been started on heparin infusion. PCCM evaluated the case and given his brain metastases, there was no role for lytics.   Subjective: The patient is resting in bed and appears comfortable.  He tells me he still having some pleuritic type chest pain.  He reports generalized crampy abdominal pain.  He denies nausea or vomiting.  He does report some ongoing dyspnea.  Assessment & Plan:  Acute PE w/ R heart strain  - continue full anticoagulation - Lovenox will be most appropriate for ongoing treatment in the setting of known metastatic cancer so we'll transition to that now  Lung CA w/ mets to brain  - has been receiving care in [place], [place] at [facility] with Oncologist, Dr. Ludger Nutting - records requested  - s/p second gamma-knife procedure 01/20/17, followed by HA's that improved with steroid   Possible LLL cavitary PNA - on empiric abx tx - stop vanc as MRSA screen negative   Normocytic anemia - thrombocytopenia - likely due to CA and tx of same - follow trend   DVT prophylaxis: IV heparin > lovenox    Code Status: DNR - NO CODE Family Communication: no family present at time of exam  Disposition Plan: SDU  Consultants:  PCCM  Procedures: none  Antimicrobials:  Cefepime 2/27 > Vanc 2/27  Objective: Blood pressure 128/69, pulse 97, temperature 99.4 F (37.4 C), temperature source Axillary, resp. rate 16, height '5\' 6"'$  (1.676 m), weight 76 kg (167 lb 8.8 oz), SpO2 99 %.  Intake/Output Summary (Last 24 hours) at 02/04/17 1021 Last data filed at 02/04/17 0500  Gross per 24 hour  Intake            682.4 ml  Output             1100 ml  Net           -417.6 ml   Filed Weights   02/04/2017 2215 02/04/17 0130 02/04/17 0143  Weight: 77.1 kg (170 lb) 76 kg (167 lb 8.8 oz) 76 kg (167 lb 8.8 oz)    Examination: General: No acute respiratory distress Lungs: Clear to auscultation bilaterally without wheezes or crackles Cardiovascular: Regular rate and rhythm without murmur gallop or rub normal S1 and S2 Abdomen: Nontender, nondistended, soft, bowel sounds positive, no rebound, no ascites, no appreciable mass Extremities: No significant cyanosis, clubbing, or edema bilateral lower extremities  CBC:  Recent Labs Lab 01/08/2017 2007 01/20/2017 2008 02/04/17 0624  WBC 11.0*  --  9.6  HGB 12.5* 12.6* 11.8*  HCT 38.0* 37.0* 36.8*  MCV 90.0  --  90.2  PLT 70*  --  72*   Basic Metabolic Panel:  Recent Labs Lab 02/02/2017 2008 02/04/17 0001 02/04/17 0624  NA 138 139 142  K 4.1 5.0 3.9  CL 102 103 103  CO2  --  25 28  GLUCOSE 106* 92 77  BUN 31* 22* 18  CREATININE 0.70 0.74 0.76  CALCIUM  --  8.5* 8.5*   GFR: Estimated Creatinine Clearance: 76.4 mL/min (by C-G formula based on SCr of 0.76 mg/dL).  Liver Function Tests:  Recent Labs Lab 02/04/17 0001  AST 53*  ALT 54  ALKPHOS 58  BILITOT 1.4*  PROT 5.4*  ALBUMIN 2.6*   Cardiac Enzymes:  Recent Labs Lab 02/04/17 0001 02/04/17 0624  TROPONINI 0.17* 0.15*    CBG:  Recent Labs Lab 02/04/17 0806  GLUCAP 96     Recent Results (from the past 240 hour(s))  MRSA PCR Screening     Status: None   Collection Time: 02/04/17  1:37 AM  Result Value Ref Range Status   MRSA by PCR NEGATIVE NEGATIVE Final    Comment:        The GeneXpert MRSA Assay (FDA approved for NASAL specimens only), is one component of a comprehensive MRSA colonization surveillance program. It is not intended to diagnose MRSA infection nor to guide or monitor treatment for MRSA infections.      Scheduled Meds: . ceFEPime (MAXIPIME) IV  1 g Intravenous Q8H  . pantoprazole (PROTONIX) IV  40 mg Intravenous Q24H  . sodium chloride flush  3 mL Intravenous Q12H  . vancomycin  1,000 mg Intravenous Q12H   Continuous Infusions: . sodium chloride 75 mL/hr at 02/04/17 0130  . heparin 1,200 Units/hr (02/04/17 0130)     LOS: 1 day   Cherene Altes, MD [facility] Office  [phone] Pager - Text Page per Shea Evans as per below:  On-Call/Text Page:      [email or web address]      password [email or web address]  If 7PM-7AM, please contact night-coverage [email or web address] Password [facility] 02/04/2017, 10:21 AM

## 2017-02-04 NOTE — Care Management Note (Signed)
Case Management Note  Patient Details  Name: Nour Scalise MRN: [id number] Date of Birth: 1946-06-28  Subjective/Objective:  Pt was receiving cancer treatment @ [facility] in [place], [place] and per receptionist @ [place], was referred to [facility].  CM notified liaison of admission.  Pt confused and daughter did not answer telephone but according to staff, daughter brought pt to [facility] - note in chart indicates pt's caregiver in [place] was no longer able to provide care.               Expected Discharge Plan:  [place]  Discharge planning Services  CM Consult  Post Acute Care Choice:  Resumption of Svcs/PTA Provider  [facility] Agency:  [place]  Status of Service:  In process, will continue to follow  Girard Cooter, RN 02/04/2017, 3:25 PM

## 2017-02-05 ENCOUNTER — Inpatient Hospital Stay (HOSPITAL_COMMUNITY): Payer: Medicare Other

## 2017-02-05 DIAGNOSIS — I2602 Saddle embolus of pulmonary artery with acute cor pulmonale: Secondary | ICD-10-CM

## 2017-02-05 DIAGNOSIS — J181 Lobar pneumonia, unspecified organism: Secondary | ICD-10-CM

## 2017-02-05 DIAGNOSIS — R6510 Systemic inflammatory response syndrome (SIRS) of non-infectious origin without acute organ dysfunction: Secondary | ICD-10-CM

## 2017-02-05 DIAGNOSIS — J189 Pneumonia, unspecified organism: Secondary | ICD-10-CM

## 2017-02-05 LAB — COMPREHENSIVE METABOLIC PANEL
ALBUMIN: 2 g/dL — AB (ref 3.5–5.0)
ALT: 37 U/L (ref 17–63)
AST: 27 U/L (ref 15–41)
Alkaline Phosphatase: 42 U/L (ref 38–126)
Anion gap: 5 (ref 5–15)
BUN: 35 mg/dL — AB (ref 6–20)
CHLORIDE: 110 mmol/L (ref 101–111)
CO2: 25 mmol/L (ref 22–32)
Calcium: 7.7 mg/dL — ABNORMAL LOW (ref 8.9–10.3)
Creatinine, Ser: 0.97 mg/dL (ref 0.61–1.24)
GFR calc Af Amer: >60 mL/min (ref >60)
GFR calc non Af Amer: >60 mL/min (ref >60)
GLUCOSE: 87 mg/dL (ref 65–99)
POTASSIUM: 4.6 mmol/L (ref 3.5–5.1)
Sodium: 140 mmol/L (ref 135–145)
Total Bilirubin: 1.2 mg/dL (ref 0.3–1.2)
Total Protein: 4.3 g/dL — ABNORMAL LOW (ref 6.5–8.1)

## 2017-02-05 LAB — CBC
HCT: 30.8 % — ABNORMAL LOW (ref 39.0–52.0)
Hemoglobin: 10.1 g/dL — ABNORMAL LOW (ref 13.0–17.0)
MCH: 29.8 pg (ref 26.0–34.0)
MCHC: 32.8 g/dL (ref 30.0–36.0)
MCV: 90.9 fL (ref 78.0–100.0)
PLATELETS: 71 10*3/uL — AB (ref 150–400)
RBC: 3.39 MIL/uL — ABNORMAL LOW (ref 4.22–5.81)
RDW: 16.4 % — AB (ref 11.5–15.5)
WBC: 8.5 10*3/uL (ref 4.0–10.5)

## 2017-02-05 MED ORDER — HALOPERIDOL LACTATE 5 MG/ML IJ SOLN
1.0000 mg | Freq: Four times a day (QID) | INTRAMUSCULAR | Status: DC | PRN
Start: 2017-02-05 — End: 2017-02-06
  Administered 2017-02-05: 1 mg via INTRAVENOUS
  Filled 2017-02-05: qty 1

## 2017-02-05 MED ORDER — METOPROLOL TARTRATE 5 MG/5ML IV SOLN
INTRAVENOUS | Status: AC
  Administered 2017-02-05: 22:00:00
  Filled 2017-02-05: qty 5

## 2017-02-05 MED ORDER — METOPROLOL TARTRATE 5 MG/5ML IV SOLN
5.0000 mg | INTRAVENOUS | Status: DC | PRN
  Administered 2017-02-05: 5 mg via INTRAVENOUS

## 2017-02-05 MED ORDER — SODIUM CHLORIDE 0.9 % IV BOLUS (SEPSIS)
1000.0000 mL | INTRAVENOUS | Status: AC
  Administered 2017-02-05: 1000 mL via INTRAVENOUS

## 2017-02-05 NOTE — Progress Notes (Signed)
Condom cath. applied 3x but pt.wiggles on bed that causing the cath to come out.

## 2017-02-05 NOTE — Progress Notes (Signed)
Bp-60/45, by automatic bp,asymptomatic,  manual bp- 80/50. Md aware. With order. Continue to monitor.

## 2017-02-05 NOTE — Progress Notes (Addendum)
PROGRESS NOTE                                                                                                                                                                                                             Patient Demographics:    Julian Bryant, is a 71 y.o. male, DOB - [birth year], ZOX:[id number]  Admit date - 01/13/2017   Admitting Physician Vianne Bulls, MD  Outpatient Primary MD for the patient is No PCP Per Patient  LOS - 2  Outpatient Specialists: Oncology in [place]  Chief Complaint  Patient presents with  . Chest Pain       Brief Narrative   71 y.o.malewith history of lung cancer with brain metastasis receiving treatment in [facility] who presented to the ED with several days of cough productive of thick brown sputum, and acute onset of chest pain radiating through to the back.  Patient underwent his second gamma knife treatment on 01/20/2017.   In the ED D-dimer was elevated to 17.08. CT angio revealed PE within the pulmonary arteries to right upper lobe, right lower lobe, and left lower lobe with CT evidence for right heart strain. Also noted was a 2.6 cm cavitary lesion with air-fluid level in the left base, likely an atypical infection v/s malignancy. Enlarged mediastinal nodes are noted on the CTA. Code PE was activated by the ED provider and the patient has been started on heparin infusion. PCCM evaluated the case and given his brain metastases, there was no role for lytics.     Subjective:   Febrile and hypotensive overnight, tachy to 150s.  HR and BP improved this am. C/o substernal CP on dep inspiration, poorly communicative.   Assessment  & Plan :    Principal Problem:   Acute Bilateral pulmonary embolism ([place]) with ? Rt heart strain Risk factor includes underlying lung malignancy. Patient hypotensive and tachycardic overnight. Continued to have temperature spikes (MAXIMUM  TEMPERATURE 102.32F). Heart rate and blood pressure stable this morning  without much intervention. Antiplatelet medication with subcutaneous Lovenox. No role for  lytics due to history of brain metastases. -2-D echo with EF of 65-70% and cannot adequately evaluate right heart strain.  Given present symptoms and extent of emboli patient will need at least 6 months of systemic anticoagulation and given his underlying malignancy may need lifelong. (Defer to  his oncologist).    Active Problems:   Lung cancer ([place])   Brain metastasis [facility]) Sees oncologist in [place], [place]. ([facility] with Oncologist, Dr. Ludger Nutting - spoke at length with her today on the phone. Patient has left upper lobe lesion with mediastinal lymphadenopathy. Has cMET mutation due to which  -  is only getting targeted treatment. Also given his oral poor functional status she feels she is not a good candidate for aggressive treatment. She's awaiting insurance approval to try  tiyrosine kinase inhibitor . Patient underwent gamma-knife procedure  on 01/20/17 for brain metastases, followed by headache that improved with steroid.    Normocytic anemia/ Thrombocytopenia ([place]) Possibly due to chemotherapy. Continue to monitor.  SIRS with Postobstructive pneumonia Possible left lower lobe cavitating pneumonia with fever. On empiric Cefepime. Blood culture results pending.  Elevated troponin Possibly demand ischemia/right heart strain. Monitor for now.  Lethargy/ poor communication  Possibly complicated with SIRS. As per oncologist patient is not well communicative at baseline  Code Status : DO NOT RESUSCITATE  Family Communication  : updated daughter  and sister on the phone.  Disposition Plan  : continue stepdown monitoring  Barriers For Discharge : active symptoms  Consults  :   PCCM ( on admission)  Procedures  :  CT angio chest  2d echo  DVT Prophylaxis  :  Therapeutic Lovenox  Lab Results    Component Value Date   PLT 71 (L) 02/05/2017    Antibiotics  :    Anti-infectives    Start     Dose/Rate Route Frequency Ordered Stop   01/11/2017 2315  ceFEPIme (MAXIPIME) 1 g in dextrose 5 % 50 mL IVPB     1 g 100 mL/hr over 30 Minutes Intravenous Every 8 hours 01/09/2017 2300 02/11/17 2159   01/22/2017 2315  vancomycin (VANCOCIN) IVPB 1000 mg/200 mL premix  Status:  Discontinued     1,000 mg 200 mL/hr over 60 Minutes Intravenous Every 12 hours 01/30/2017 2308 02/04/17 1032        Objective:   Vitals:   02/04/17 2300 02/05/17 0300 02/05/17 0500 02/05/17 0803  BP: (!) 94/53 (!) 91/52  (!) 82/52  Pulse: (!) 111 (!) 103  95  Resp: (!) 27 (!) 25  14  Temp: (!) 102.7 F (39.3 C)   (!) 102 F (38.9 C)  TempSrc: Oral Oral  Oral  SpO2: 98% 98%  100%  Weight:   77.2 kg (170 lb 3.1 oz)   Height:        Wt Readings from Last 3 Encounters:  02/05/17 77.2 kg (170 lb 3.1 oz)     Intake/Output Summary (Last 24 hours) at 02/05/17 0846 Last data filed at 02/05/17 0300  Gross per 24 hour  Intake             1589 ml  Output                0 ml  Net             1589 ml     Physical Exam  Gen: fatigued HEENT: no pallor, moist mucosa, supple neck Chest: scattered rhonchi b/l CVS:  S1&S2 tachycardic, no murmurs, rubs or gallop GI: soft, NT, ND, BS+ Musculoskeletal: warm, no edema CNS: Alert and oriented, slow to communicate    Data Review:    CBC  Recent Labs Lab 02/01/2017 2007 01/15/2017 2008 02/04/17 0624 02/05/17 0253  WBC 11.0*  --  9.6 8.5  HGB 12.5* 12.6* 11.8* 10.1*  HCT 38.0* 37.0* 36.8* 30.8*  PLT 70*  --  72* 71*  MCV 90.0  --  90.2 90.9  MCH 29.6  --  28.9 29.8  MCHC 32.9  --  32.1 32.8  RDW 15.9*  --  16.0* 16.4*    Chemistries   Recent Labs Lab 01/19/2017 2008 02/04/17 0001 02/04/17 0624 02/05/17 0253  NA 138 139 142 140  K 4.1 5.0 3.9 4.6  CL 102 103 103 110  CO2  --  '25 28 25  '$ GLUCOSE 106* 92 77 87  BUN 31* 22* 18 35*  CREATININE 0.70 0.74  0.76 0.97  CALCIUM  --  8.5* 8.5* 7.7*  AST  --  53*  --  27  ALT  --  54  --  37  ALKPHOS  --  58  --  42  BILITOT  --  1.4*  --  1.2   ------------------------------------------------------------------------------------------------------------------ No results for input(s): CHOL, HDL, LDLCALC, TRIG, CHOLHDL, LDLDIRECT in the last 72 hours.  No results found for: HGBA1C ------------------------------------------------------------------------------------------------------------------ No results for input(s): TSH, T4TOTAL, T3FREE, THYROIDAB in the last 72 hours.  Invalid input(s): FREET3 ------------------------------------------------------------------------------------------------------------------ No results for input(s): VITAMINB12, FOLATE, FERRITIN, TIBC, IRON, RETICCTPCT in the last 72 hours.  Coagulation profile No results for input(s): INR, PROTIME in the last 168 hours.   Recent Labs  01/29/2017 2003  DDIMER 17.08*    Cardiac Enzymes  Recent Labs Lab 02/04/17 0001 02/04/17 0624 02/04/17 1155  TROPONINI 0.17* 0.15* 0.14*   ------------------------------------------------------------------------------------------------------------------ No results found for: BNP  Inpatient Medications  Scheduled Meds: . benztropine  1 mg Oral BID  . ceFEPime (MAXIPIME) IV  1 g Intravenous Q8H  . docusate sodium  100 mg Oral BID  . enoxaparin (LOVENOX) injection  80 mg Subcutaneous Q12H  . levETIRAcetam  500 mg Oral BID  . metoprolol tartrate  12.5 mg Oral BID  . pantoprazole  40 mg Oral Q1200  . propylthiouracil  50 mg Oral Daily  . risperidone  4 mg Oral BID  . sertraline  50 mg Oral Daily  . sodium chloride flush  3 mL Intravenous Q12H  . tamsulosin  0.4 mg Oral Daily   Continuous Infusions: . sodium chloride 50 mL/hr at 02/04/17 1530   PRN Meds:.acetaminophen **OR** acetaminophen, bisacodyl, ipratropium-albuterol, morphine injection, ondansetron **OR** ondansetron  (ZOFRAN) IV, oxyCODONE, polyethylene glycol  Micro Results Recent Results (from the past 240 hour(s))  MRSA PCR Screening     Status: None   Collection Time: 02/04/17  1:37 AM  Result Value Ref Range Status   MRSA by PCR NEGATIVE NEGATIVE Final    Comment:        The GeneXpert MRSA Assay (FDA approved for NASAL specimens only), is one component of a comprehensive MRSA colonization surveillance program. It is not intended to diagnose MRSA infection nor to guide or monitor treatment for MRSA infections.     Radiology Reports Dg Chest 2 View  Result Date: 01/22/2017 CLINICAL DATA:  Left-sided chest pain. Patient reports lung cancer with radiation last Monday. EXAM: CHEST  2 VIEW COMPARISON:  None. FINDINGS: The lungs are hyperinflated. Heart is at the upper limits of normal in size. Mild aortic tortuosity and atherosclerosis. Equivocal fullness in the left hilum. Patient's known lung cancer is not well seen radiographically. Streaky left basilar atelectasis. Presumed nipple shadows project over the lung bases. No dominant pulmonary mass or radiographic evident nodule. No pleural fluid or pneumothorax. No acute osseous  abnormalities are seen. IMPRESSION: Hyperinflation with streaky left basilar atelectasis. Patient's known lung cancer is not well seen. Equivocal fullness in the left hilum. No prior exams available for comparison. Electronically Signed   By: Jeb Levering M.D.   On: 01/12/2017 19:50   Ct Angio Chest Pe W And/or Wo Contrast  Result Date: 01/14/2017 CLINICAL DATA:  Acute onset of sharp left-sided chest pain. Confusion. Known brain metastases. Initial encounter. EXAM: CT ANGIOGRAPHY CHEST WITH CONTRAST TECHNIQUE: Multidetector CT imaging of the chest was performed using the standard protocol during bolus administration of intravenous contrast. Multiplanar CT image reconstructions and MIPs were obtained to evaluate the vascular anatomy. CONTRAST:  80 mL of Isovue 370 IV  contrast COMPARISON:  Chest radiograph performed earlier today at 7:25 p.m. FINDINGS: Cardiovascular: Pulmonary embolus is noted within the pulmonary arteries to the right upper lobe, right lower lobe and left lower lobe, with an RV/LV ratio of 1.1, corresponding to right heart strain and at least submassive pulmonary embolus. The heart is normal in size. Scattered calcification is noted along the thoracic aorta. The great vessels are grossly unremarkable in appearance. Mediastinum/Nodes: A large 1.8 cm periaortic node is seen, and additional smaller mediastinal nodes are noted, including a 1.3 cm aortopulmonary window node, likely reflecting metastatic disease. No pericardial effusion is identified. The thyroid gland is unremarkable in appearance. No axillary lymphadenopathy is appreciated. Lungs/Pleura: Note is made of a focal 2.6 cm cavitary lesion with an air-fluid level at the left lung base. The appearance is more suspicious for atypical infection rather than malignancy, though given the patient's history of lung cancer, malignancy cannot be excluded. Would correlate with the patient's clinical findings. Mild bibasilar atelectasis is noted. No pleural effusion or pneumothorax is seen. Upper Abdomen: The visualized portions of the liver and spleen are grossly unremarkable. The visualized portions of the adrenal glands are grossly unremarkable. A left renal cyst is seen. Musculoskeletal: No acute osseous abnormalities are identified. The visualized musculature is unremarkable in appearance. Mild bilateral gynecomastia is noted. Review of the MIP images confirms the above findings. IMPRESSION: 1. Pulmonary embolus within the pulmonary arteries to the right upper lobe, right lower lobe and left lower lobe. CT evidence of right heart strain (RV/LV Ratio = 1.1) consistent with at least submassive (intermediate risk) PE. The presence of right heart strain has been associated with an increased risk of morbidity and  mortality. Please activate Code PE by paging [phone]. 2. 2.6 cm cavitary lesion with an air-fluid level of the left lung base. The appearance is more suspicious for atypical infection rather than malignancy, though given the patient's history of lung cancer, malignancy cannot be excluded. Would correlate with the patient's clinical findings. 3. Enlarged mediastinal nodes measure up to 1.8 cm, likely reflecting metastatic disease. 4. Mild bibasilar atelectasis noted. 5. Left renal cyst seen. 6. Mild bilateral gynecomastia noted. Critical Value/emergent results were called by telephone at the time of interpretation on 01/31/2017 at 9:54 pm to [facility] NP, who verbally acknowledged these results. Electronically Signed   By: Garald Balding M.D.   On: 01/20/2017 21:58    Time Spent in minutes 35   Louellen Molder M.D on 02/05/2017 at 8:46 AM  Between 7am to 7pm - Pager - [phone]  After 7pm go to [email or web address] - password [facility]  [facility] -  Office  [phone]

## 2017-02-05 DEATH — deceased

## 2017-02-06 ENCOUNTER — Inpatient Hospital Stay (HOSPITAL_COMMUNITY): Payer: Medicare Other

## 2017-02-06 DIAGNOSIS — I2609 Other pulmonary embolism with acute cor pulmonale: Secondary | ICD-10-CM

## 2017-02-06 LAB — POCT I-STAT 3, ART BLOOD GAS (G3+)
ACID-BASE DEFICIT: 8 mmol/L — AB (ref 0.0–2.0)
BICARBONATE: 15.8 mmol/L — AB (ref 20.0–28.0)
O2 Saturation: 98 %
PO2 ART: 105 mmHg (ref 83.0–108.0)
Patient temperature: 98.7
TCO2: 17 mmol/L (ref 0–100)
pCO2 arterial: 24.4 mmHg — ABNORMAL LOW (ref 32.0–48.0)
pH, Arterial: 7.42 (ref 7.350–7.450)

## 2017-02-06 MED FILL — Medication: Qty: 1 | Status: AC

## 2017-02-09 LAB — CULTURE, BLOOD (ROUTINE X 2)
CULTURE: NO GROWTH
Culture: NO GROWTH

## 2017-03-08 NOTE — Progress Notes (Signed)
Patient was dnr but after updating patient daughter with his worsening status, daugther  to rescinded code status. Code blue called at [place]. Daughter called off the code at 63. Chaplain support provided to family.

## 2017-03-08 NOTE — Progress Notes (Signed)
   Met w/ family @ bedside.  Introduced Art gallery manager.  Family contacting local clergy.   Page on-call chaplain, as needed.  - Rev. [place] MDiv ThM

## 2017-03-08 NOTE — Progress Notes (Signed)
Spoke with daughter, Bereket Gernert who changed patients code status to full code. Code blue initiated promptly, on call md at bedside, and daughter enroute to hospital.

## 2017-03-08 NOTE — Progress Notes (Signed)
I was called by the nurse stating patient's blood pressure was50s/30s with heart rate in 50s. After reviewing the records it seems like patient was admitted for pulmonary embolism with cardiac strain but he was deemed not a good candidate for thrombolytic therapy. Due to low blood pressure I ordered normal saline bolus but half way into his bolus I received a call again stating patient was breathing down in heart rate of 30s. At this time patient was unresponsive. As a started going to his room patient went into asystole and at that time daughters said to do everything possible even though patient was DO NOT RESUSCITATE. At this time ACLS protocol for asystole was initiated and he was given multiple rounds of epinephrine and 2 rounds of sodium bicarbonate. His blood sugar was around 150s at the time. Despite of several rounds of chest compressions and medications he remained unresponsive and in asystole. His daughter was present at the bedside and stated recussitative efforts can be stopped at this time. Rhythm check was performed he's to remain in asystole no breath sounds were noted on physical exam members no corneal or pupillary reflex, no gag reflex, no heart sounds and no breath sounds were present. Time of death was called at 12:35 Am. I suspect he most likely went into cardiogenic shock secondary to acute pulmonary embolism. Chaplain was notified. All the questions were answered for the family and further help was extended if needed. Death certificate was filled out.  For details of the code please refer to the chart. Call with further questions if needed.

## 2017-03-08 NOTE — Progress Notes (Signed)
Patient was pronounced dead at 68 after daughter asked for resuscitation to be stopped. RN Purcell Nails and RN Page Denyse Dago independently  auscultated no heart beat and spontaneous respiration for greater than one minute.

## 2017-03-08 NOTE — Code Documentation (Signed)
  Patient Name: Julian Bryant   MRN: [id number]   Date of Birth/ Sex: 04-09-1946 , male      Admission Date: 01/11/2017  Attending Provider: Louellen Molder, MD  Primary Diagnosis: Other acute pulmonary embolism with acute cor pulmonale ([place]) [I26.09]   Indication: Pt was in his usual state of health until this PM, when he was noted to be progressively hypotensive and bradycardic. Code blue was subsequently called. At the time of arrival on scene, ACLS protocol was underway. Dr. Reesa Chew, [facility], was present and running the Code, IMTS provided occasional assistance when Dr. Reesa Chew was talking with the family. Patient was admitted originally with PE, on lovenox, also with metastatic lung cancer with brain mets. Patient was previously DNR, but reversed to FULL CODE, when patient's family was called to be notified of worsening clinically status. Patient was consistently in asystole. Family was present for code and after 20 minutes they asked the team to stop ACLS and all efforts. Time of death was pronounced at 0035.    Technical Description:  - CPR performance duration:  20 minutes  - Was defibrillation or cardioversion used? No   - Was external pacer placed? No  - Was patient intubated pre/post CPR? No   Medications Administered: Y = Yes; Blank = No Amiodarone    Atropine    Calcium    Epinephrine  Yes  Lidocaine    Magnesium    Norepinephrine    Phenylephrine    Sodium bicarbonate  Yes  Vasopressin    Other      Miscellaneous Information:  - Time of death:  17 AM  - Primary team notified?  Yes  - Family Notified? Yes     Martyn Malay, DO PGY-3 Internal Medicine Resident Pager # [phone] 2017/02/11 12:25 AM

## 2017-03-08 NOTE — Progress Notes (Signed)
Normal saline wide opened to patient.

## 2017-03-08 NOTE — Progress Notes (Signed)
Bolus of 177m Ns ongoing, stat chest xray, abg being checked. Family contacted.

## 2017-03-08 NOTE — Discharge Summary (Signed)
Physician Discharge Summary  Julian Bryant RJJ:[id number] DOB: [birth year] DOA: 01/28/2017  PCP: No PCP Per Patient  Admit date: 01/22/2017 Discharge date: 02/11/2017  Admitted From: home  Patient expired on Feb 10, 2017 at 12:35 am    Discharge Diagnoses:  Principal Problem:   Acute pulmonary embolism ([place])   Active Problems:   Lung cancer ([place])   Brain metastasis ([place])   Thrombocytopenia ([place])   Obstructive pneumonia   Chest pain   Normocytic anemia   Dehydration  Brief/Interim Summary: 71 y.o.malewith history of lung cancer with brain metastasis receiving treatment in [place] who presented to the ED with several days of cough productive of thick brown sputum, and acute onset of chest pain radiating through to the back. Patient underwenthis second gamma knife treatment on 01/20/2017.   In the ED D-dimer was elevated to 17.08. CT angio revealed PE within the pulmonary arteries to right upper lobe, right lower lobe, and left lower lobe with CT evidence for right heart strain. Also noted was a 2.6 cm cavitary lesion with air-fluid level in the left base, likely an atypical infection v/s malignancy. Enlarged mediastinal nodes are noted on the CTA. Code PE was activated by the ED provider and the patient has been started on heparin infusion. PCCM evaluated the case and given his brain metastases, there was no role for lytics. She was admitted to stepdown unit for close monitoring.  Hospital course:   Principal Problem:   Acute Bilateral pulmonary embolism ([place]) with ? Rt heart strain Risk factor includes underlying lung malignancy. No role for  lytics due to history of brain metastases. -2-D echo with EF of 65-70% and cannot adequately evaluate right heart strain.  Patient hypotensive and tachycardic overnight. Continued to have temperature spikes (MAXIMUM TEMPERATURE 102.39F). Heart rate and blood pressure were stable in the morning But patient again became hypotensive in the  afternoon. ([place] 80/50 mmHg) 71 was given 1 L normal saline bolus after which blood pressure improved to 112/53 mmHg.  During the night he was progressively hypotensive and bradycardic. I'll midnight CODE BLUE was called and ACLS protocol was underway. Patient was DO NOT RESUSCITATE but after discussing with his daughter he was reversed a full code.  Patient was consistently in asystole. Family was present for code and after 20 minutes they asked the team to stop ACLS and all efforts.   Patient as pronunced dead at 12:35 AM on February 10, 2017.  Active Problems:     Lung cancer ([place])   Brain metastasis [facility]) Sees oncologist in [place], [place]. ([place] with Oncologist, Dr. Ludger Nutting - spoke at length with her on the phone. Patient has left upper lobe lesion with mediastinal lymphadenopathy. 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 Also given his oral poor functional status she feels she is not a good candidate for aggressive treatment. She was awaiting insurance approval to try  tiyrosine kinase inhibitor . Patient underwent gamma-knife procedure  on 01/20/17 for brain metastases, followed by headache that improved with steroid.    Normocytic anemia/ Thrombocytopenia ([place]) Possibly due to chemotherapy. Continue to monitor.  SIRS with Postobstructive pneumonia Possible left lower lobe cavitating pneumonia with fever. Was on empiric Cefepime.   Elevated troponin Possibly demand ischemia/right heart strain.  Lethargy/ poor communication  Possibly complicated with SIRS. As per oncologist patient  was not well communicative at baseline    Family Communication  : updated daughter  and sister on the phone.    Consults  :  PCCM ( on admission)  Procedures  :  CT angio chest  2d echo      Discharge Instructions     Allergies  Allergen Reactions  . Lisinopril Swelling  . Mafenide Swelling  . Chlorzoxazone Itching and Rash       Procedures/Studies: Dg Chest 2 View  Result Date: 02/02/2017 CLINICAL DATA:  Left-sided chest pain. Patient reports lung cancer with radiation last Monday. EXAM: CHEST  2 VIEW COMPARISON:  None. FINDINGS: The lungs are hyperinflated. Heart is at the upper limits of normal in size. Mild aortic tortuosity and atherosclerosis. Equivocal fullness in the left hilum. Patient's known lung cancer is not well seen radiographically. Streaky left basilar atelectasis. Presumed nipple shadows project over the lung bases. No dominant pulmonary mass or radiographic evident nodule. No pleural fluid or pneumothorax. No acute osseous abnormalities are seen. IMPRESSION: Hyperinflation with streaky left basilar atelectasis. Patient's known lung cancer is not well seen. Equivocal fullness in the left hilum. No prior exams available for comparison. Electronically Signed   By: Jeb Levering M.D.   On: 01/21/2017 19:50   Ct Angio Chest Pe W And/or Wo Contrast  Result Date: 01/14/2017 CLINICAL DATA:  Acute onset of sharp left-sided chest pain. Confusion. Known brain metastases. Initial encounter. EXAM: CT ANGIOGRAPHY CHEST WITH CONTRAST TECHNIQUE: Multidetector CT imaging of the chest was performed using the standard protocol during bolus administration of intravenous contrast. Multiplanar CT image reconstructions and MIPs were obtained to evaluate the vascular anatomy. CONTRAST:  80 mL of Isovue 370 IV contrast COMPARISON:  Chest radiograph performed earlier today at 7:25 p.m. FINDINGS: Cardiovascular: Pulmonary embolus is noted within the pulmonary arteries to the right upper lobe, right lower lobe and left lower lobe, with an RV/LV ratio of 1.1, corresponding to right heart strain and at least submassive pulmonary embolus. The heart is normal in size. Scattered calcification is noted along the thoracic aorta. The great vessels are grossly unremarkable in appearance. Mediastinum/Nodes: A large 1.8 cm periaortic node is  seen, and additional smaller mediastinal nodes are noted, including a 1.3 cm aortopulmonary window node, likely reflecting metastatic disease. No pericardial effusion is identified. The thyroid gland is unremarkable in appearance. No axillary lymphadenopathy is appreciated. Lungs/Pleura: Note is made of a focal 2.6 cm cavitary lesion with an air-fluid level at the left lung base. The appearance is more suspicious for atypical infection rather than malignancy, though given the patient's history of lung cancer, malignancy cannot be excluded. Would correlate with the patient's clinical findings. Mild bibasilar atelectasis is noted. No pleural effusion or pneumothorax is seen. Upper Abdomen: The visualized portions of the liver and spleen are grossly unremarkable. The visualized portions of the adrenal glands are grossly unremarkable. A left renal cyst is seen. Musculoskeletal: No acute osseous abnormalities are identified. The visualized musculature is unremarkable in appearance. Mild bilateral gynecomastia is noted. Review of the MIP images confirms the above findings. IMPRESSION: 1. Pulmonary embolus within the pulmonary arteries to the right upper lobe, right lower lobe and left lower lobe. CT evidence of right heart strain (RV/LV Ratio = 1.1) consistent with at least submassive (intermediate risk) PE. The presence of right heart strain has been associated with an increased risk of morbidity and mortality. Please activate Code PE by paging [phone]. 2. 2.6 cm cavitary lesion with an air-fluid level of the left lung base. The appearance is more suspicious for atypical infection rather than malignancy, though given the patient's history of lung cancer, malignancy cannot be excluded. Would  correlate with the patient's clinical findings. 3. Enlarged mediastinal nodes measure up to 1.8 cm, likely reflecting metastatic disease. 4. Mild bibasilar atelectasis noted. 5. Left renal cyst seen. 6. Mild bilateral  gynecomastia noted. Critical Value/emergent results were called by telephone at the time of interpretation on 01/21/2017 at 9:54 pm to [facility] NP, who verbally acknowledged these results. Electronically Signed   By: Garald Balding M.D.   On: 01/09/2017 21:58   Dg Chest Port 1 View  Result Date: 02/05/2017 CLINICAL DATA:  Shortness of breath, history metastatic lung cancer to brain EXAM: PORTABLE CHEST 1 VIEW COMPARISON:  Portable exam 1339 hours compared to 01/19/2017 FINDINGS: Upper normal heart size. Mediastinal contours and pulmonary vascularity normal. Atherosclerotic calcification aorta. Atelectasis versus infiltrate LEFT lower lobe. Mild central peribronchial thickening. Lungs otherwise clear. No pleural effusion or pneumothorax. Endplate spur formation thoracic spine with osseous demineralization and evidence of prior cervical spine fusion. IMPRESSION: Aortic atherosclerosis. Mild atelectasis versus infiltrate in LEFT lower lobe. Electronically Signed   By: [place] Dana M.D.   On: 02/05/2017 14:53       Discharge Exam: Vitals:   Feb 24, 2017 0001 Feb 24, 2017 0005  BP: (!) 57/46 (!) 61/46  Pulse:    Resp: 11 13  Temp:     Vitals:   02/05/17 1900 02/05/17 2100 2017-02-24 0001 02/24/2017 0005  BP: (!) 80/65 (!) 112/53 (!) 57/46 (!) 61/46  Pulse: (!) 125     Resp: (!) '23 19 11 13  '$ Temp: 98.4 F (36.9 C)     TempSrc: Oral     SpO2: 93%     Weight:      Height:           The results of significant diagnostics from this hospitalization (including imaging, microbiology, ancillary and laboratory) are listed below for reference.     Microbiology: Recent Results (from the past 240 hour(s))  Culture, blood (routine x 2) Call MD if unable to obtain prior to antibiotics being given     Status: None   Collection Time: 02/04/17 12:01 AM  Result Value Ref Range Status   Specimen Description BLOOD LEFT ANTECUBITAL  Final   Special Requests IN PEDIATRIC BOTTLE [place]  Final   Culture NO GROWTH 5  DAYS  Final   Report Status 02/09/2017 FINAL  Final  Culture, blood (routine x 2) Call MD if unable to obtain prior to antibiotics being given     Status: None   Collection Time: 02/04/17 12:08 AM  Result Value Ref Range Status   Specimen Description BLOOD LEFT ANTECUBITAL  Final   Special Requests BOTTLES DRAWN AEROBIC AND ANAEROBIC 5CC  Final   Culture NO GROWTH 5 DAYS  Final   Report Status 02/09/2017 FINAL  Final  MRSA PCR Screening     Status: None   Collection Time: 02/04/17  1:37 AM  Result Value Ref Range Status   MRSA by PCR NEGATIVE NEGATIVE Final    Comment:        The GeneXpert MRSA Assay (FDA approved for NASAL specimens only), is one component of a comprehensive MRSA colonization surveillance program. It is not intended to diagnose MRSA infection nor to guide or monitor treatment for MRSA infections.      Labs: BNP (last 3 results) No results for input(s): BNP in the last 8760 hours. Basic Metabolic Panel:  Recent Labs Lab 02/05/17 0253  NA 140  K 4.6  CL 110  CO2 25  GLUCOSE 87  BUN 35*  CREATININE  0.97  CALCIUM 7.7*   Liver Function Tests:  Recent Labs Lab 02/05/17 0253  AST 27  ALT 37  ALKPHOS 42  BILITOT 1.2  PROT 4.3*  ALBUMIN 2.0*   No results for input(s): LIPASE, AMYLASE in the last 168 hours. No results for input(s): AMMONIA in the last 168 hours. CBC:  Recent Labs Lab 02/05/17 0253  WBC 8.5  HGB 10.1*  HCT 30.8*  MCV 90.9  PLT 71*   Cardiac Enzymes: No results for input(s): CKTOTAL, CKMB, CKMBINDEX, TROPONINI in the last 168 hours. BNP: Invalid input(s): POCBNP CBG:  Recent Labs Lab 02/04/17 1929 02/04/17 2225  GLUCAP 199* 142*   D-Dimer No results for input(s): DDIMER in the last 72 hours. Hgb A1c No results for input(s): HGBA1C in the last 72 hours. Lipid Profile No results for input(s): CHOL, HDL, LDLCALC, TRIG, CHOLHDL, LDLDIRECT in the last 72 hours. Thyroid function studies No results for input(s):  TSH, T4TOTAL, T3FREE, THYROIDAB in the last 72 hours.  Invalid input(s): FREET3 Anemia work up No results for input(s): VITAMINB12, FOLATE, FERRITIN, TIBC, IRON, RETICCTPCT in the last 72 hours. Urinalysis No results found for: COLORURINE, APPEARANCEUR, [place], [place], [place], [place], [place], [place], PROTEINUR, UROBILINOGEN, NITRITE, LEUKOCYTESUR Sepsis Labs Invalid input(s): PROCALCITONIN,  WBC,  LACTICIDVEN Microbiology Recent Results (from the past 240 hour(s))  Culture, blood (routine x 2) Call MD if unable to obtain prior to antibiotics being given     Status: None   Collection Time: 02/04/17 12:01 AM  Result Value Ref Range Status   Specimen Description BLOOD LEFT ANTECUBITAL  Final   Special Requests IN PEDIATRIC BOTTLE [place]  Final   Culture NO GROWTH 5 DAYS  Final   Report Status 02/09/2017 FINAL  Final  Culture, blood (routine x 2) Call MD if unable to obtain prior to antibiotics being given     Status: None   Collection Time: 02/04/17 12:08 AM  Result Value Ref Range Status   Specimen Description BLOOD LEFT ANTECUBITAL  Final   Special Requests BOTTLES DRAWN AEROBIC AND ANAEROBIC 5CC  Final   Culture NO GROWTH 5 DAYS  Final   Report Status 02/09/2017 FINAL  Final  MRSA PCR Screening     Status: None   Collection Time: 02/04/17  1:37 AM  Result Value Ref Range Status   MRSA by PCR NEGATIVE NEGATIVE Final    Comment:        The GeneXpert MRSA Assay (FDA approved for NASAL specimens only), is one component of a comprehensive MRSA colonization surveillance program. It is not intended to diagnose MRSA infection nor to guide or monitor treatment for MRSA infections.      Time coordinating discharge: <30 minutes  SIGNED:   Louellen Molder, MD  [facility] 02/11/2017, 5:05 PM Pager   If 7PM-7AM, please contact night-coverage [email or web address] Password [email or web address]

## 2017-03-08 DEATH — deceased

## 2017-08-31 IMAGING — CT CT ANGIO CHEST
3 of 7 series · 17 of 36 positions shown · IV contrast (Omni 300)
Comparison: Chest radiograph performed earlier today at [DATE] p.m.

CLINICAL DATA: Acute onset of sharp left-sided chest pain.
Confusion. Known brain metastases. Initial encounter.

EXAM:
CT ANGIOGRAPHY CHEST WITH CONTRAST
TECHNIQUE: Multidetector CT imaging of the chest was performed using the
standard protocol during bolus administration of intravenous
contrast. Multiplanar CT image reconstructions and MIPs were
obtained to evaluate the vascular anatomy.
CONTRAST:  80 mL of Isovue 370 IV contrast

[Series 5: pe thins · axial · 0.71mm/px · z∈[+1182,+1431]mm · 12 of 295 slices shown]
[im 23/295  lung]
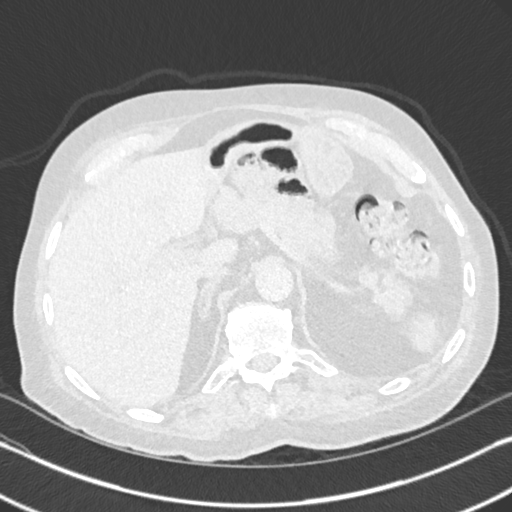
[im 46/295  mediastinal]
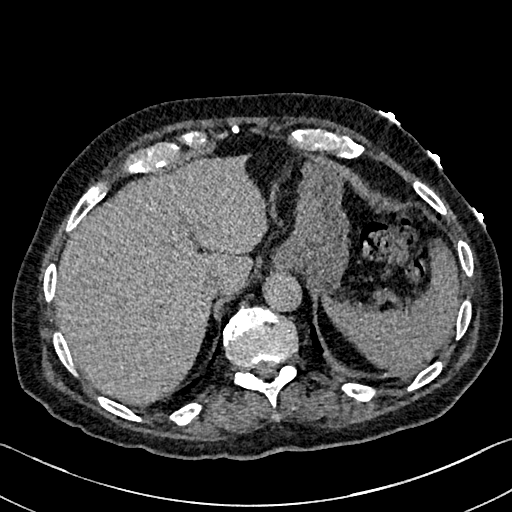
[im 68/295  lung]
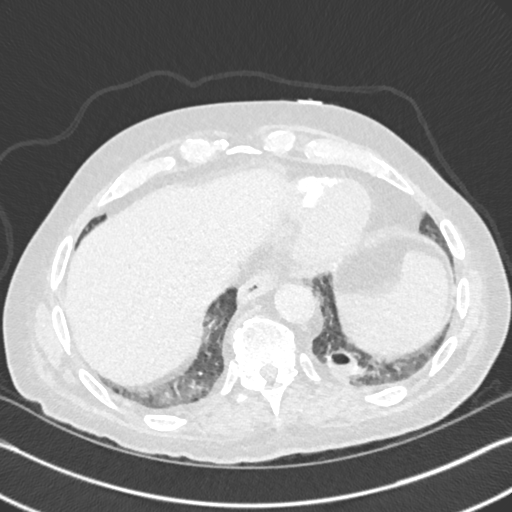
[im 91/295  mediastinal]
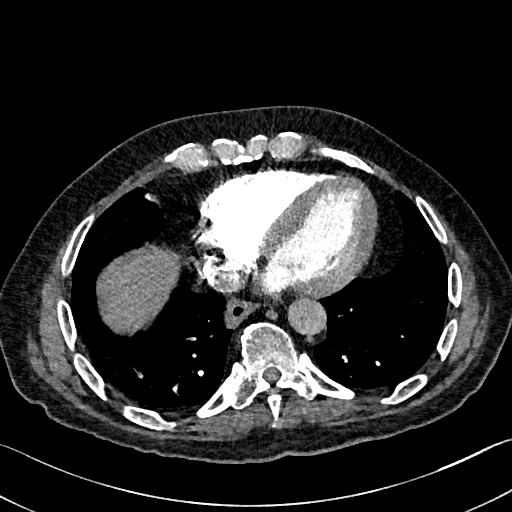
[im 114/295  lung]
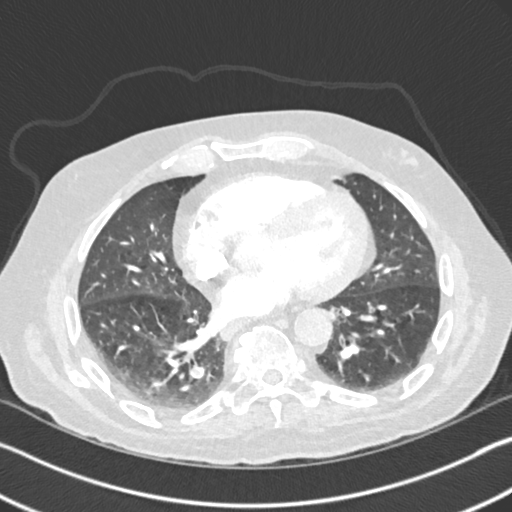
[im 136/295  mediastinal]
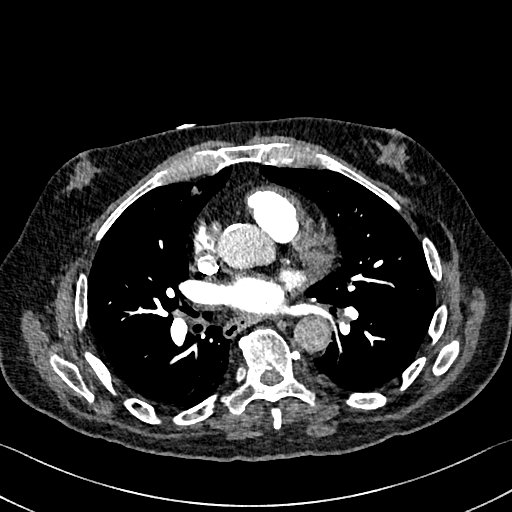
[im 159/295  lung]
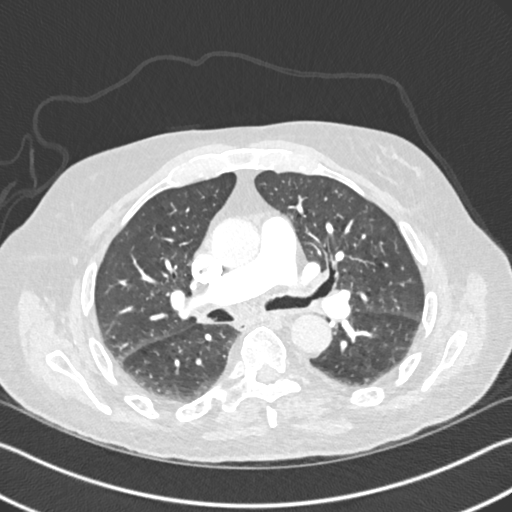
[im 181/295  mediastinal]
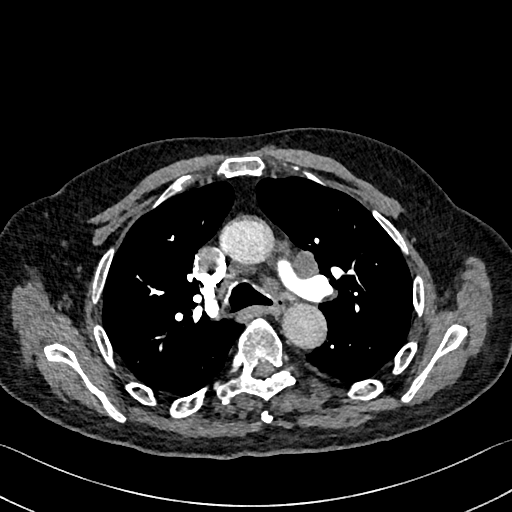
[im 204/295  lung]
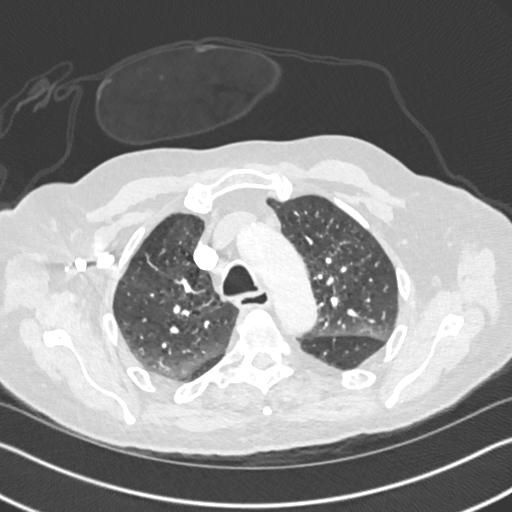
[im 227/295  mediastinal]
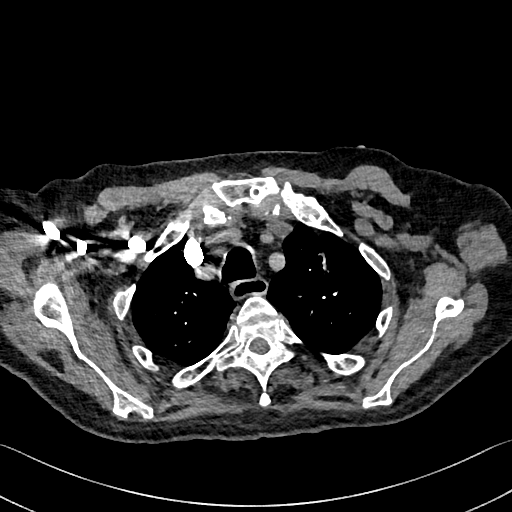
[im 249/295  lung]
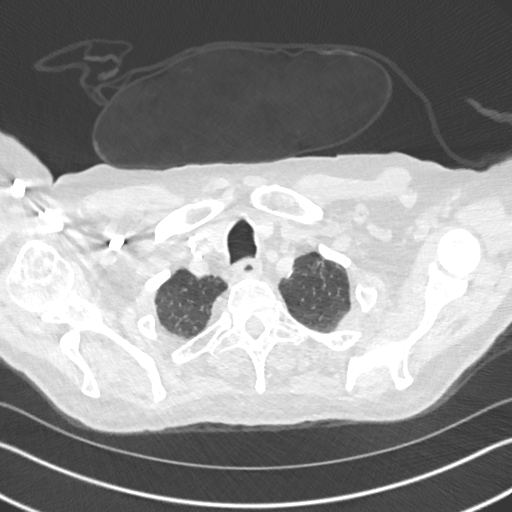
[im 272/295  mediastinal]
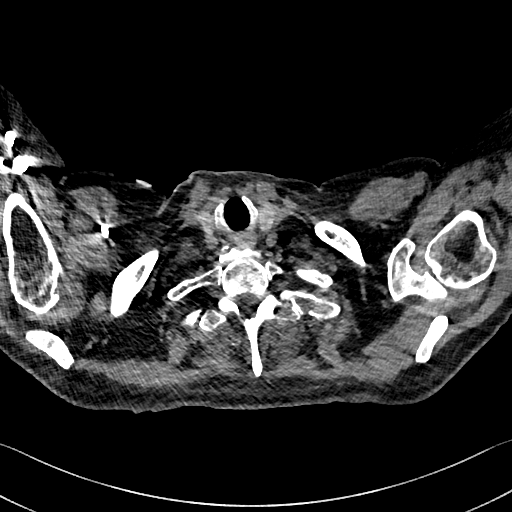

[Series 6: pe lung · axial · 0.71mm/px · z∈[+1224,+1362]mm · 4 of 139 slices shown]
[im 24/139  mediastinal]
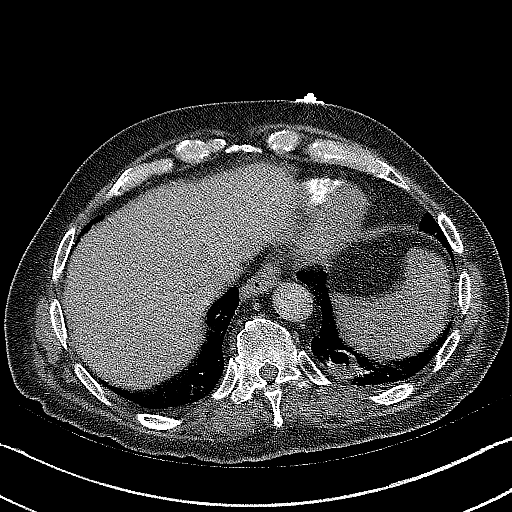
[im 47/139  mediastinal]
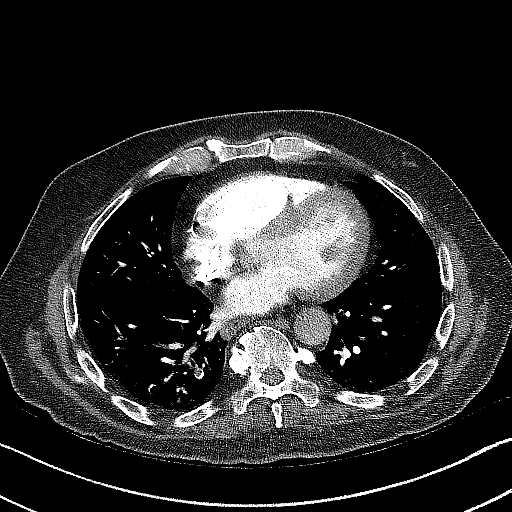
[im 70/139  mediastinal]
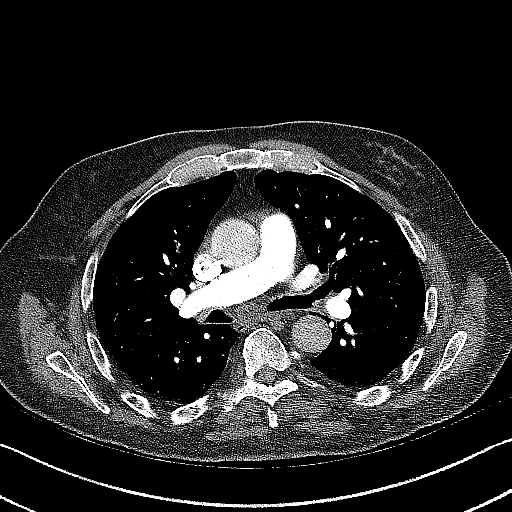
[im 93/139  mediastinal]
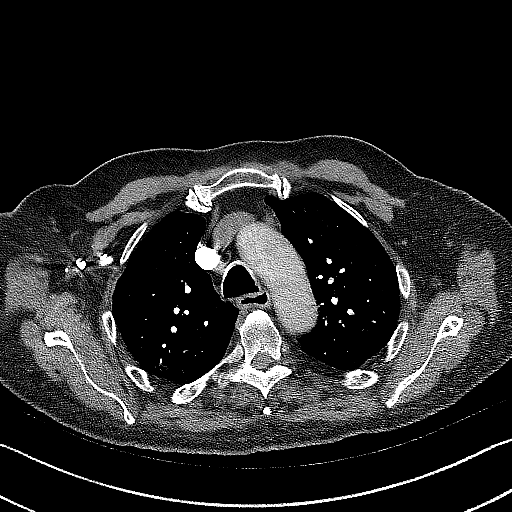

[Series 7: pe 2mm cor · coronal · 0.58mm/px · 1 of 123 slices shown]
[im 62/123  mediastinal]
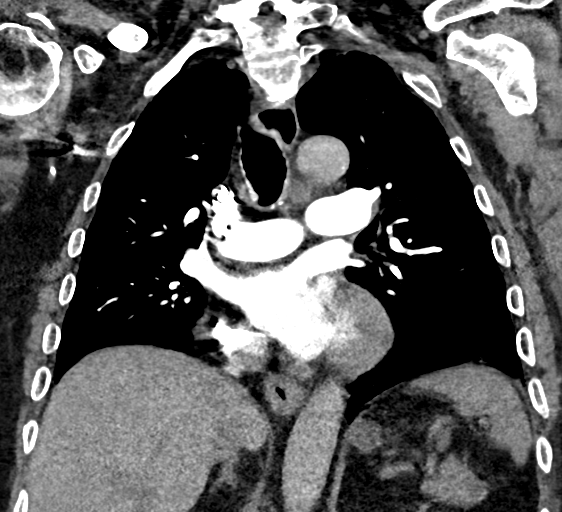

[17 of 36 positions shown; findings below may reference images not displayed]

FINDINGS: Cardiovascular: Pulmonary embolus is noted within the pulmonary
arteries to the right upper lobe, right lower lobe and left lower
lobe, with an RV/LV ratio of 1.1, corresponding to right heart
strain and at least submassive pulmonary embolus.

The heart is normal in size. Scattered calcification is noted along
the thoracic aorta. The great vessels are grossly unremarkable in
appearance.

Mediastinum/Nodes: A large 1.8 cm periaortic node is seen, and
additional smaller mediastinal nodes are noted, including a 1.3 cm
aortopulmonary window node, likely reflecting metastatic disease. No
pericardial effusion is identified. The thyroid gland is
unremarkable in appearance. No axillary lymphadenopathy is
appreciated.

Lungs/Pleura: Note is made of a focal 2.6 cm cavitary lesion with an
air-fluid level at the left lung base. The appearance is more
suspicious for atypical infection rather than malignancy, though
given the patient's history of lung cancer, malignancy cannot be
excluded. Would correlate with the patient's clinical findings.

Mild bibasilar atelectasis is noted. No pleural effusion or
pneumothorax is seen.

Upper Abdomen: The visualized portions of the liver and spleen are
grossly unremarkable. The visualized portions of the adrenal glands
are grossly unremarkable. A left renal cyst is seen.

Musculoskeletal: No acute osseous abnormalities are identified. The
visualized musculature is unremarkable in appearance. Mild bilateral
gynecomastia is noted.

Review of the MIP images confirms the above findings.
IMPRESSION: 1. Pulmonary embolus within the pulmonary arteries to the right
upper lobe, right lower lobe and left lower lobe. CT evidence of
right heart strain (RV/LV Ratio = 1.1) consistent with at least
submassive (intermediate risk) PE. The presence of right heart
strain has been associated with an increased risk of morbidity and
mortality. Please activate Code PE by paging [phone].
2. 2.6 cm cavitary lesion with an air-fluid level of the left lung
base. The appearance is more suspicious for atypical infection
rather than malignancy, though given the patient's history of lung
cancer, malignancy cannot be excluded. Would correlate with the
patient's clinical findings.
3. Enlarged mediastinal nodes measure up to 1.8 cm, likely
reflecting metastatic disease.
4. Mild bibasilar atelectasis noted.
5. Left renal cyst seen.
6. Mild bilateral gynecomastia noted.

Critical Value/emergent results were called by telephone at the time
of interpretation on 02/03/2017 at [DATE] to SORIN OXENDINE NP, who
verbally acknowledged these results.
